# Patient Record
Sex: Female | Born: 1970 | Hispanic: Yes | Marital: Married | Smoking: Never smoker
Health system: Southern US, Community
[De-identification: ages and names within clinical notes are randomized; demographics above are authoritative.]

## PROBLEM LIST (undated history)

## (undated) DIAGNOSIS — K8020 Calculus of gallbladder without cholecystitis without obstruction: Secondary | ICD-10-CM

## (undated) DIAGNOSIS — N809 Endometriosis, unspecified: Secondary | ICD-10-CM

## (undated) DIAGNOSIS — I341 Nonrheumatic mitral (valve) prolapse: Secondary | ICD-10-CM

## (undated) DIAGNOSIS — K219 Gastro-esophageal reflux disease without esophagitis: Secondary | ICD-10-CM

## (undated) DIAGNOSIS — G43909 Migraine, unspecified, not intractable, without status migrainosus: Secondary | ICD-10-CM

## (undated) DIAGNOSIS — R112 Nausea with vomiting, unspecified: Secondary | ICD-10-CM

## (undated) DIAGNOSIS — Z8489 Family history of other specified conditions: Secondary | ICD-10-CM

## (undated) DIAGNOSIS — Z9889 Other specified postprocedural states: Secondary | ICD-10-CM

## (undated) DIAGNOSIS — R519 Headache, unspecified: Secondary | ICD-10-CM

## (undated) HISTORY — PX: CHOLECYSTECTOMY: SHX55

## (undated) HISTORY — DX: Migraine, unspecified, not intractable, without status migrainosus: G43.909

## (undated) HISTORY — DX: Endometriosis, unspecified: N80.9

## (undated) HISTORY — PX: TUBAL LIGATION: SHX77

## (undated) HISTORY — DX: Gastro-esophageal reflux disease without esophagitis: K21.9

## (undated) HISTORY — PX: DIAGNOSTIC LAPAROSCOPY: SUR761

## (undated) HISTORY — PX: APPENDECTOMY: SHX54

---

## 2017-02-01 ENCOUNTER — Other Ambulatory Visit: Payer: Self-pay | Admitting: Obstetrics & Gynecology

## 2017-02-01 ENCOUNTER — Other Ambulatory Visit (HOSPITAL_COMMUNITY)
Admission: RE | Admit: 2017-02-01 | Discharge: 2017-02-01 | Disposition: A | Discharge status: Home / Self Care | Payer: BLUE CROSS/BLUE SHIELD | Source: Ambulatory Visit | Attending: Obstetrics & Gynecology | Admitting: Obstetrics & Gynecology

## 2017-02-01 DIAGNOSIS — Z1151 Encounter for screening for human papillomavirus (HPV): Secondary | ICD-10-CM | POA: Insufficient documentation

## 2017-02-01 DIAGNOSIS — Z01419 Encounter for gynecological examination (general) (routine) without abnormal findings: Secondary | ICD-10-CM | POA: Diagnosis present

## 2017-02-07 LAB — CYTOLOGY - PAP
DIAGNOSIS: NEGATIVE
HPV: NOT DETECTED

## 2018-12-02 ENCOUNTER — Other Ambulatory Visit: Payer: Self-pay | Admitting: Obstetrics & Gynecology

## 2018-12-02 DIAGNOSIS — Z1231 Encounter for screening mammogram for malignant neoplasm of breast: Secondary | ICD-10-CM

## 2018-12-03 ENCOUNTER — Ambulatory Visit: Payer: BLUE CROSS/BLUE SHIELD

## 2018-12-09 ENCOUNTER — Ambulatory Visit
Admission: RE | Admit: 2018-12-09 | Discharge: 2018-12-09 | Disposition: A | Discharge status: Home / Self Care | Payer: BLUE CROSS/BLUE SHIELD | Source: Ambulatory Visit | Attending: Obstetrics & Gynecology | Admitting: Obstetrics & Gynecology

## 2018-12-09 ENCOUNTER — Encounter: Payer: Self-pay | Admitting: Radiology

## 2018-12-09 DIAGNOSIS — Z1231 Encounter for screening mammogram for malignant neoplasm of breast: Secondary | ICD-10-CM

## 2019-05-27 ENCOUNTER — Other Ambulatory Visit: Payer: Self-pay | Admitting: Obstetrics and Gynecology

## 2019-06-05 NOTE — Patient Instructions (Addendum)
YOU ARE SCHEDULED FOR A COVID TEST _8-8-20________@_____945am_______ . THIS TEST MUST BE DONE BEFORE SURGERY. GO TO  [address], [place], [address] AND REMAIN IN YOUR CAR, THIS IS A DRIVE UP TEST. ONCE YOUR COVID TEST IS DONE PLEASE FOLLOW ALL THE QUARANTINE  INSTRUCTIONS GIVEN IN YOUR HANDOUT.      Your procedure is scheduled on 06-11-19   Report to [facility]   Call this number if you have problems the morning of surgery  [phone].   OUR ADDRESS IS [place].  WE ARE LOCATED IN [facility].                                     REMEMBER:  DO NOT EAT FOOD OR DRINK LIQUIDS AFTER MIDNIGHT .   TAKE THESE MEDICATIONS MORNING OF SURGERY WITH A SIP OF WATER:  _NONE_________________________________  IF YOU ARE SPENDING THE NIGHT AFTER SURGERY PLEASE BRING ALL YOUR PRESCRIPTION MEDICATIONS IN THEIR ORIGINAL BOTTLES.                                    DO NOT WEAR JEWERLY, MAKE UP, OR NAIL POLISH,  DO NOT WEAR LOTIONS, POWDERS, PERFUMES OR DEODORANT. DO NOT SHAVE FOR 24 HOURS PRIOR TO DAY OF SURGERY. MEN MAY SHAVE FACE AND NECK. CONTACTS, GLASSES, OR DENTURES MAY NOT BE WORN TO SURGERY.                                    [facility] IS NOT RESPONSIBLE  FOR ANY BELONGINGS.                                                                    Marland Kitchen                                                                                                          [facility] - Preparing for Surgery Before surgery, you can play an important role.  Because skin is not sterile, your skin needs to be as free of germs as possible.  You can reduce the number of germs on your skin by washing with CHG (chlorahexidine gluconate) soap before surgery.  CHG is an antiseptic cleaner which kills germs and bonds with the skin to continue killing germs even after washing. Please DO NOT use if you have an allergy to CHG or antibacterial soaps.  If your  skin becomes reddened/irritated stop using the CHG and inform your nurse when you arrive at  Short Stay. Do not shave (including legs and underarms) for at least 48 hours prior to the first CHG shower.  You may shave your face/neck. Please follow these instructions carefully:  1.  Shower with CHG Soap the night before surgery and the  morning of Surgery.  2.  If you choose to wash your hair, wash your hair first as usual with your  normal  shampoo.  3.  After you shampoo, rinse your hair and body thoroughly to remove the  shampoo.                           4.  Use CHG as you would any other liquid soap.  You can apply chg directly  to the skin and wash                       Gently with a scrungie or clean washcloth.  5.  Apply the CHG Soap to your body ONLY FROM THE NECK DOWN.   Do not use on face/ open                           Wound or open sores. Avoid contact with eyes, ears mouth and genitals (private parts).                       Wash face,  Genitals (private parts) with your normal soap.             6.  Wash thoroughly, paying special attention to the area where your surgery  will be performed.  7.  Thoroughly rinse your body with warm water from the neck down.  8.  DO NOT shower/wash with your normal soap after using and rinsing off  the CHG Soap.                9.  Pat yourself dry with a clean towel.            10.  Wear clean pajamas.            11.  Place clean sheets on your bed the night of your first shower and do not  sleep with pets. Day of Surgery : Do not apply any lotions/deodorants the morning of surgery.  Please wear clean clothes to the hospital/surgery center.  FAILURE TO FOLLOW THESE INSTRUCTIONS MAY RESULT IN THE CANCELLATION OF YOUR SURGERY PATIENT SIGNATURE_________________________________  NURSE SIGNATURE__________________________________  ________________________________________________________________________   Denise Reeves  An incentive spirometer  is a tool that can help keep your lungs clear and active. This tool measures how well you are filling your lungs with each breath. Taking long deep breaths may help reverse or decrease the chance of developing breathing (pulmonary) problems (especially infection) following:  A long period of time when you are unable to move or be active. BEFORE THE PROCEDURE   If the spirometer includes an indicator to show your best effort, your nurse or respiratory therapist will set it to a desired goal.  If possible, sit up straight or lean slightly forward. Try not to slouch.  Hold the incentive spirometer in an upright position. INSTRUCTIONS FOR USE  1. Sit on the edge of your bed if possible, or sit up as far as you can in bed or on a chair. 2. Hold the incentive spirometer in an upright position. 3. Breathe out normally. 4. Place the  mouthpiece in your mouth and seal your lips tightly around it. 5. Breathe in slowly and as deeply as possible, raising the piston or the ball toward the top of the column. 6. Hold your breath for 3-5 seconds or for as long as possible. Allow the piston or ball to fall to the bottom of the column. 7. Remove the mouthpiece from your mouth and breathe out normally. 8. Rest for a few seconds and repeat Steps 1 through 7 at least 10 times every 1-2 hours when you are awake. Take your time and take a few normal breaths between deep breaths. 9. The spirometer may include an indicator to show your best effort. Use the indicator as a goal to work toward during each repetition. 10. After each set of 10 deep breaths, practice coughing to be sure your lungs are clear. If you have an incision (the cut made at the time of surgery), support your incision when coughing by placing a pillow or rolled up towels firmly against it. Once you are able to get out of bed, walk around indoors and cough well. You may stop using the incentive spirometer when instructed by your caregiver.  RISKS AND  COMPLICATIONS  Take your time so you do not get dizzy or light-headed.  If you are in pain, you may need to take or ask for pain medication before doing incentive spirometry. It is harder to take a deep breath if you are having pain. AFTER USE  Rest and breathe slowly and easily.  It can be helpful to keep track of a log of your progress. Your caregiver can provide you with a simple table to help with this. If you are using the spirometer at home, follow these instructions: [place] IF:   You are having difficultly using the spirometer.  You have trouble using the spirometer as often as instructed.  Your pain medication is not giving enough relief while using the spirometer.  You develop fever of 100.5 F (38.1 C) or higher. SEEK IMMEDIATE MEDICAL CARE IF:   You cough up bloody sputum that had not been present before.  You develop fever of 102 F (38.9 C) or greater.  You develop worsening pain at or near the incision site. MAKE SURE YOU:   Understand these instructions.  Will watch your condition.  Will get help right away if you are not doing well or get worse. Document Released: 02/26/2007 Document Revised: 01/08/2012 Document Reviewed: 04/29/2007 Exit[employer] Patient Information 2014 Exit[employer], [place].   ________________________________________________________________________  WHAT IS A BLOOD TRANSFUSION? Blood Transfusion Information  A transfusion is the replacement of blood or some of its parts. Blood is made up of multiple cells which provide different functions.  Red blood cells carry oxygen and are used for blood loss replacement.  White blood cells fight against infection.  Platelets control bleeding.  Plasma helps clot blood.  Other blood products are available for specialized needs, such as hemophilia or other clotting disorders. BEFORE THE TRANSFUSION  Who gives blood for transfusions?   Healthy volunteers who are fully evaluated to make sure  their blood is safe. This is blood bank blood. Transfusion therapy is the safest it has ever been in the practice of medicine. Before blood is taken from a donor, a complete history is taken to make sure that person has no history of diseases nor engages in risky social behavior (examples are intravenous drug use or sexual activity with multiple partners). The donor's travel history is screened to  minimize risk of transmitting infections, such as malaria. The donated blood is tested for signs of infectious diseases, such as HIV and hepatitis. The blood is then tested to be sure it is compatible with you in order to minimize the chance of a transfusion reaction. If you or a relative donates blood, this is often done in anticipation of surgery and is not appropriate for emergency situations. It takes many days to process the donated blood. RISKS AND COMPLICATIONS Although transfusion therapy is very safe and saves many lives, the main dangers of transfusion include:   Getting an infectious disease.  Developing a transfusion reaction. This is an allergic reaction to something in the blood you were given. Every precaution is taken to prevent this. The decision to have a blood transfusion has been considered carefully by your caregiver before blood is given. Blood is not given unless the benefits outweigh the risks. AFTER THE TRANSFUSION  Right after receiving a blood transfusion, you will usually feel much better and more energetic. This is especially true if your red blood cells have gotten low (anemic). The transfusion raises the level of the red blood cells which carry oxygen, and this usually causes an energy increase.  The nurse administering the transfusion will monitor you carefully for complications. HOME CARE INSTRUCTIONS  No special instructions are needed after a transfusion. You may find your energy is better. Speak with your caregiver about any limitations on activity for underlying diseases  you may have. SEEK MEDICAL CARE IF:   Your condition is not improving after your transfusion.  You develop redness or irritation at the intravenous (IV) site. SEEK IMMEDIATE MEDICAL CARE IF:  Any of the following symptoms occur over the next 12 hours:  Shaking chills.  You have a temperature by mouth above 102 F (38.9 C), not controlled by medicine.  Chest, back, or muscle pain.  People around you feel you are not acting correctly or are confused.  Shortness of breath or difficulty breathing.  Dizziness and fainting.  You get a rash or develop hives.  You have a decrease in urine output.  Your urine turns a dark color or changes to pink, red, or brown. Any of the following symptoms occur over the next 10 days:  You have a temperature by mouth above 102 F (38.9 C), not controlled by medicine.  Shortness of breath.  Weakness after normal activity.  The white part of the eye turns yellow (jaundice).  You have a decrease in the amount of urine or are urinating less often.  Your urine turns a dark color or changes to pink, red, or brown. Document Released: 10/13/2000 Document Revised: 01/08/2012 Document Reviewed: 06/01/2008 [facility] Patient Information 2014 [place], [place].  _______________________________________________________________________

## 2019-06-06 ENCOUNTER — Other Ambulatory Visit: Payer: Self-pay

## 2019-06-06 ENCOUNTER — Encounter (HOSPITAL_COMMUNITY): Payer: Self-pay

## 2019-06-06 ENCOUNTER — Encounter (INDEPENDENT_AMBULATORY_CARE_PROVIDER_SITE_OTHER): Payer: Self-pay

## 2019-06-06 ENCOUNTER — Encounter (HOSPITAL_COMMUNITY)
Admission: RE | Admit: 2019-06-06 | Discharge: 2019-06-06 | Disposition: A | Discharge status: Home / Self Care | Payer: BC Managed Care – PPO | Source: Ambulatory Visit | Attending: Obstetrics and Gynecology | Admitting: Obstetrics and Gynecology

## 2019-06-06 DIAGNOSIS — Z01812 Encounter for preprocedural laboratory examination: Secondary | ICD-10-CM | POA: Insufficient documentation

## 2019-06-06 DIAGNOSIS — N800 Endometriosis of uterus: Secondary | ICD-10-CM | POA: Diagnosis not present

## 2019-06-06 DIAGNOSIS — R102 Pelvic and perineal pain: Secondary | ICD-10-CM | POA: Diagnosis not present

## 2019-06-06 HISTORY — DX: Other specified postprocedural states: R11.2

## 2019-06-06 HISTORY — DX: Calculus of gallbladder without cholecystitis without obstruction: K80.20

## 2019-06-06 HISTORY — DX: Headache, unspecified: R51.9

## 2019-06-06 HISTORY — DX: Family history of other specified conditions: Z84.89

## 2019-06-06 HISTORY — DX: Nonrheumatic mitral (valve) prolapse: I34.1

## 2019-06-06 HISTORY — DX: Other specified postprocedural states: Z98.890

## 2019-06-06 LAB — CBC
HCT: 42.7 % (ref 36.0–46.0)
Hemoglobin: 14.1 g/dL (ref 12.0–15.0)
MCH: 30.3 pg (ref 26.0–34.0)
MCHC: 33 g/dL (ref 30.0–36.0)
MCV: 91.8 fL (ref 80.0–100.0)
Platelets: 307 10*3/uL (ref 150–400)
RBC: 4.65 MIL/uL (ref 3.87–5.11)
RDW: 13.5 % (ref 11.5–15.5)
WBC: 9.6 10*3/uL (ref 4.0–10.5)
nRBC: 0 % (ref 0.0–0.2)

## 2019-06-07 ENCOUNTER — Other Ambulatory Visit (HOSPITAL_COMMUNITY)
Admission: RE | Admit: 2019-06-07 | Discharge: 2019-06-07 | Disposition: A | Discharge status: Home / Self Care | Payer: BC Managed Care – PPO | Source: Ambulatory Visit | Attending: Obstetrics and Gynecology | Admitting: Obstetrics and Gynecology

## 2019-06-07 DIAGNOSIS — Z01812 Encounter for preprocedural laboratory examination: Secondary | ICD-10-CM | POA: Diagnosis not present

## 2019-06-07 DIAGNOSIS — Z20828 Contact with and (suspected) exposure to other viral communicable diseases: Secondary | ICD-10-CM | POA: Insufficient documentation

## 2019-06-07 LAB — SARS CORONAVIRUS 2 (TAT 6-24 HRS): SARS Coronavirus 2: NEGATIVE

## 2019-06-07 LAB — ABO/RH: ABO/RH(D): B POS

## 2019-06-11 ENCOUNTER — Ambulatory Visit (HOSPITAL_BASED_OUTPATIENT_CLINIC_OR_DEPARTMENT_OTHER): Payer: BC Managed Care – PPO | Admitting: Physician Assistant

## 2019-06-11 ENCOUNTER — Observation Stay (HOSPITAL_BASED_OUTPATIENT_CLINIC_OR_DEPARTMENT_OTHER)
Admission: RE | Admit: 2019-06-11 | Discharge: 2019-06-12 | Disposition: A | Discharge status: Home / Self Care | Payer: BC Managed Care – PPO | Attending: Obstetrics and Gynecology | Admitting: Obstetrics and Gynecology

## 2019-06-11 ENCOUNTER — Other Ambulatory Visit: Payer: Self-pay

## 2019-06-11 ENCOUNTER — Other Ambulatory Visit: Payer: Self-pay | Admitting: Obstetrics and Gynecology

## 2019-06-11 ENCOUNTER — Encounter (HOSPITAL_BASED_OUTPATIENT_CLINIC_OR_DEPARTMENT_OTHER): Payer: Self-pay | Admitting: Certified Registered Nurse Anesthetist

## 2019-06-11 ENCOUNTER — Ambulatory Visit (HOSPITAL_BASED_OUTPATIENT_CLINIC_OR_DEPARTMENT_OTHER): Payer: BC Managed Care – PPO | Admitting: Certified Registered Nurse Anesthetist

## 2019-06-11 ENCOUNTER — Encounter (HOSPITAL_BASED_OUTPATIENT_CLINIC_OR_DEPARTMENT_OTHER)
Admission: RE | Disposition: A | Discharge status: Home / Self Care | Payer: Self-pay | Source: Home / Self Care | Attending: Obstetrics and Gynecology

## 2019-06-11 DIAGNOSIS — R112 Nausea with vomiting, unspecified: Secondary | ICD-10-CM | POA: Diagnosis not present

## 2019-06-11 DIAGNOSIS — Z90710 Acquired absence of both cervix and uterus: Secondary | ICD-10-CM | POA: Diagnosis present

## 2019-06-11 DIAGNOSIS — N9971 Accidental puncture and laceration of a genitourinary system organ or structure during a genitourinary system procedure: Secondary | ICD-10-CM | POA: Diagnosis not present

## 2019-06-11 DIAGNOSIS — N920 Excessive and frequent menstruation with regular cycle: Secondary | ICD-10-CM | POA: Diagnosis present

## 2019-06-11 DIAGNOSIS — Y658 Other specified misadventures during surgical and medical care: Secondary | ICD-10-CM | POA: Insufficient documentation

## 2019-06-11 DIAGNOSIS — N809 Endometriosis, unspecified: Secondary | ICD-10-CM | POA: Diagnosis present

## 2019-06-11 DIAGNOSIS — Y838 Other surgical procedures as the cause of abnormal reaction of the patient, or of later complication, without mention of misadventure at the time of the procedure: Secondary | ICD-10-CM | POA: Diagnosis not present

## 2019-06-11 DIAGNOSIS — R102 Pelvic and perineal pain: Secondary | ICD-10-CM | POA: Diagnosis not present

## 2019-06-11 DIAGNOSIS — N800 Endometriosis of uterus: Secondary | ICD-10-CM | POA: Diagnosis present

## 2019-06-11 HISTORY — PX: ROBOTIC ASSISTED LAPAROSCOPIC HYSTERECTOMY AND SALPINGECTOMY: SHX6379

## 2019-06-11 LAB — POCT PREGNANCY, URINE: Preg Test, Ur: NEGATIVE

## 2019-06-11 LAB — TYPE AND SCREEN
ABO/RH(D): B POS
Antibody Screen: NEGATIVE

## 2019-06-11 SURGERY — XI ROBOTIC ASSISTED LAPAROSCOPIC HYSTERECTOMY AND SALPINGECTOMY
Anesthesia: General | Laterality: Bilateral

## 2019-06-11 MED ORDER — EPHEDRINE SULFATE-NACL 50-0.9 MG/10ML-% IV SOSY
PREFILLED_SYRINGE | INTRAVENOUS | Status: DC | PRN
  Administered 2019-06-11: 10 mg via INTRAVENOUS

## 2019-06-11 MED ORDER — ZOLPIDEM TARTRATE 5 MG PO TABS
5.0000 mg | ORAL_TABLET | Freq: Every evening | ORAL | Status: DC | PRN
  Filled 2019-06-11: qty 1

## 2019-06-11 MED ORDER — FENTANYL CITRATE (PF) 250 MCG/5ML IJ SOLN
INTRAMUSCULAR | Status: AC
  Filled 2019-06-11: qty 5

## 2019-06-11 MED ORDER — ACETAMINOPHEN 325 MG PO TABS
650.0000 mg | ORAL_TABLET | ORAL | Status: DC | PRN
  Filled 2019-06-11: qty 2

## 2019-06-11 MED ORDER — HYDROMORPHONE HCL 1 MG/ML IJ SOLN
INTRAMUSCULAR | Status: AC
  Filled 2019-06-11: qty 1

## 2019-06-11 MED ORDER — SCOPOLAMINE 1 MG/3DAYS TD PT72
MEDICATED_PATCH | TRANSDERMAL | Status: AC
  Filled 2019-06-11: qty 1

## 2019-06-11 MED ORDER — HYDROMORPHONE HCL 1 MG/ML IJ SOLN
0.2000 mg | INTRAMUSCULAR | Status: DC | PRN
  Administered 2019-06-11 (×2): 0.5 mg via INTRAVENOUS
  Filled 2019-06-11: qty 1

## 2019-06-11 MED ORDER — LACTATED RINGERS IV SOLN
INTRAVENOUS | Status: DC
  Administered 2019-06-11: 17:00:00 via INTRAVENOUS
  Filled 2019-06-11 (×2): qty 1000

## 2019-06-11 MED ORDER — CEFAZOLIN SODIUM-DEXTROSE 2-4 GM/100ML-% IV SOLN
INTRAVENOUS | Status: AC
  Filled 2019-06-11: qty 100

## 2019-06-11 MED ORDER — CELECOXIB 400 MG PO CAPS
400.0000 mg | ORAL_CAPSULE | ORAL | Status: AC
  Administered 2019-06-11: 400 mg via ORAL
  Filled 2019-06-11: qty 1

## 2019-06-11 MED ORDER — LACTATED RINGERS IV SOLN
INTRAVENOUS | Status: DC
  Administered 2019-06-11 – 2019-06-12 (×3): via INTRAVENOUS
  Filled 2019-06-11 (×2): qty 1000

## 2019-06-11 MED ORDER — KETOROLAC TROMETHAMINE 30 MG/ML IJ SOLN
30.0000 mg | Freq: Four times a day (QID) | INTRAMUSCULAR | Status: DC
  Administered 2019-06-11 – 2019-06-12 (×2): 30 mg via INTRAVENOUS
  Filled 2019-06-11 (×2): qty 1

## 2019-06-11 MED ORDER — SUGAMMADEX SODIUM 200 MG/2ML IV SOLN
INTRAVENOUS | Status: DC | PRN
  Administered 2019-06-11: 150 mg via INTRAVENOUS

## 2019-06-11 MED ORDER — PHENYLEPHRINE 40 MCG/ML (10ML) SYRINGE FOR IV PUSH (FOR BLOOD PRESSURE SUPPORT)
PREFILLED_SYRINGE | INTRAVENOUS | Status: DC | PRN
  Administered 2019-06-11: 80 ug via INTRAVENOUS
  Administered 2019-06-11 (×2): 40 ug via INTRAVENOUS
  Administered 2019-06-11: 120 ug via INTRAVENOUS
  Administered 2019-06-11: 80 ug via INTRAVENOUS

## 2019-06-11 MED ORDER — ONDANSETRON HCL 4 MG PO TABS
4.0000 mg | ORAL_TABLET | Freq: Four times a day (QID) | ORAL | Status: DC | PRN
  Filled 2019-06-11: qty 1

## 2019-06-11 MED ORDER — ARTIFICIAL TEARS OPHTHALMIC OINT
TOPICAL_OINTMENT | OPHTHALMIC | Status: DC | PRN
  Administered 2019-06-11: 1 via OPHTHALMIC

## 2019-06-11 MED ORDER — SIMETHICONE 80 MG PO CHEW
80.0000 mg | CHEWABLE_TABLET | Freq: Four times a day (QID) | ORAL | Status: DC | PRN
  Filled 2019-06-11: qty 1

## 2019-06-11 MED ORDER — PROPOFOL 10 MG/ML IV BOLUS
INTRAVENOUS | Status: AC
  Filled 2019-06-11: qty 20

## 2019-06-11 MED ORDER — ONDANSETRON HCL 4 MG/2ML IJ SOLN
INTRAMUSCULAR | Status: AC
  Filled 2019-06-11: qty 2

## 2019-06-11 MED ORDER — ROCURONIUM BROMIDE 10 MG/ML (PF) SYRINGE
PREFILLED_SYRINGE | INTRAVENOUS | Status: AC
  Filled 2019-06-11: qty 10

## 2019-06-11 MED ORDER — EPHEDRINE 5 MG/ML INJ
INTRAVENOUS | Status: AC
  Filled 2019-06-11: qty 10

## 2019-06-11 MED ORDER — HYDROMORPHONE HCL 1 MG/ML IJ SOLN
0.2500 mg | INTRAMUSCULAR | Status: DC | PRN
  Administered 2019-06-11: 0.25 mg via INTRAVENOUS
  Administered 2019-06-11 (×3): 0.5 mg via INTRAVENOUS
  Administered 2019-06-11: 0.25 mg via INTRAVENOUS
  Filled 2019-06-11: qty 0.5

## 2019-06-11 MED ORDER — MIDAZOLAM HCL 2 MG/2ML IJ SOLN
INTRAMUSCULAR | Status: DC | PRN
  Administered 2019-06-11: 2 mg via INTRAVENOUS

## 2019-06-11 MED ORDER — ONDANSETRON HCL 4 MG/2ML IJ SOLN
INTRAMUSCULAR | Status: DC | PRN
  Administered 2019-06-11: 4 mg via INTRAVENOUS

## 2019-06-11 MED ORDER — ACETAMINOPHEN 500 MG PO TABS
ORAL_TABLET | ORAL | Status: AC
  Filled 2019-06-11: qty 2

## 2019-06-11 MED ORDER — METHYLENE BLUE 0.5 % INJ SOLN
INTRAVENOUS | Status: AC
  Filled 2019-06-11: qty 10

## 2019-06-11 MED ORDER — STERILE WATER FOR IRRIGATION IR SOLN
Status: DC | PRN
  Administered 2019-06-11: 450 mL via INTRAVESICAL

## 2019-06-11 MED ORDER — ARTIFICIAL TEARS OPHTHALMIC OINT
TOPICAL_OINTMENT | OPHTHALMIC | Status: AC
  Filled 2019-06-11: qty 3.5

## 2019-06-11 MED ORDER — SODIUM CHLORIDE 0.9 % IV SOLN
INTRAVENOUS | Status: AC
  Filled 2019-06-11: qty 2

## 2019-06-11 MED ORDER — PROPOFOL 10 MG/ML IV BOLUS
INTRAVENOUS | Status: DC | PRN
  Administered 2019-06-11: 150 mg via INTRAVENOUS

## 2019-06-11 MED ORDER — MIDAZOLAM HCL 2 MG/2ML IJ SOLN
INTRAMUSCULAR | Status: AC
  Filled 2019-06-11: qty 2

## 2019-06-11 MED ORDER — PHENYLEPHRINE 40 MCG/ML (10ML) SYRINGE FOR IV PUSH (FOR BLOOD PRESSURE SUPPORT)
PREFILLED_SYRINGE | INTRAVENOUS | Status: AC
  Filled 2019-06-11: qty 10

## 2019-06-11 MED ORDER — PROMETHAZINE HCL 25 MG/ML IJ SOLN
6.2500 mg | INTRAMUSCULAR | Status: DC | PRN
  Filled 2019-06-11: qty 1

## 2019-06-11 MED ORDER — ROCURONIUM BROMIDE 10 MG/ML (PF) SYRINGE
PREFILLED_SYRINGE | INTRAVENOUS | Status: DC | PRN
  Administered 2019-06-11: 10 mg via INTRAVENOUS
  Administered 2019-06-11: 50 mg via INTRAVENOUS

## 2019-06-11 MED ORDER — LIDOCAINE 2% (20 MG/ML) 5 ML SYRINGE
INTRAMUSCULAR | Status: AC
  Filled 2019-06-11: qty 5

## 2019-06-11 MED ORDER — SENNA 8.6 MG PO TABS
1.0000 | ORAL_TABLET | Freq: Two times a day (BID) | ORAL | Status: DC
  Administered 2019-06-11: 8.6 mg via ORAL
  Filled 2019-06-11 (×2): qty 1

## 2019-06-11 MED ORDER — SODIUM CHLORIDE 0.9 % IV SOLN
2.0000 g | INTRAVENOUS | Status: AC
  Administered 2019-06-11: 2 g via INTRAVENOUS
  Filled 2019-06-11: qty 2

## 2019-06-11 MED ORDER — KETOROLAC TROMETHAMINE 30 MG/ML IJ SOLN
INTRAMUSCULAR | Status: AC
  Filled 2019-06-11: qty 1

## 2019-06-11 MED ORDER — SODIUM CHLORIDE 0.9 % IV SOLN
INTRAVENOUS | Status: DC | PRN
  Administered 2019-06-11: 10 mL
  Administered 2019-06-11: 35 mL
  Administered 2019-06-11: 60 mL

## 2019-06-11 MED ORDER — ALUM & MAG HYDROXIDE-SIMETH 200-200-20 MG/5ML PO SUSP
30.0000 mL | ORAL | Status: DC | PRN
  Filled 2019-06-11: qty 30

## 2019-06-11 MED ORDER — DEXAMETHASONE SODIUM PHOSPHATE 10 MG/ML IJ SOLN
INTRAMUSCULAR | Status: DC | PRN
  Administered 2019-06-11: 8 mg via INTRAVENOUS

## 2019-06-11 MED ORDER — ONDANSETRON HCL 4 MG/2ML IJ SOLN
4.0000 mg | Freq: Four times a day (QID) | INTRAMUSCULAR | Status: DC | PRN
  Administered 2019-06-11 – 2019-06-12 (×2): 4 mg via INTRAVENOUS
  Filled 2019-06-11: qty 2

## 2019-06-11 MED ORDER — LIDOCAINE HCL (CARDIAC) PF 100 MG/5ML IV SOSY
PREFILLED_SYRINGE | INTRAVENOUS | Status: DC | PRN
  Administered 2019-06-11: 100 mg via INTRAVENOUS

## 2019-06-11 MED ORDER — MENTHOL 3 MG MT LOZG
1.0000 | LOZENGE | OROMUCOSAL | Status: DC | PRN
  Filled 2019-06-11: qty 9

## 2019-06-11 MED ORDER — SODIUM CHLORIDE 0.9 % IR SOLN
Status: DC | PRN
  Administered 2019-06-11: 3000 mL

## 2019-06-11 MED ORDER — FENTANYL CITRATE (PF) 250 MCG/5ML IJ SOLN
INTRAMUSCULAR | Status: DC | PRN
  Administered 2019-06-11: 100 ug via INTRAVENOUS
  Administered 2019-06-11 (×3): 50 ug via INTRAVENOUS

## 2019-06-11 MED ORDER — IBUPROFEN 800 MG PO TABS
800.0000 mg | ORAL_TABLET | Freq: Four times a day (QID) | ORAL | Status: DC
  Filled 2019-06-11: qty 1

## 2019-06-11 MED ORDER — KETOROLAC TROMETHAMINE 30 MG/ML IJ SOLN
30.0000 mg | Freq: Once | INTRAMUSCULAR | Status: AC | PRN
  Administered 2019-06-11: 30 mg via INTRAVENOUS
  Filled 2019-06-11: qty 1

## 2019-06-11 MED ORDER — DEXAMETHASONE SODIUM PHOSPHATE 10 MG/ML IJ SOLN
INTRAMUSCULAR | Status: AC
  Filled 2019-06-11: qty 1

## 2019-06-11 MED ORDER — ACETAMINOPHEN 500 MG PO TABS
1000.0000 mg | ORAL_TABLET | ORAL | Status: AC
  Administered 2019-06-11: 1000 mg via ORAL
  Filled 2019-06-11: qty 2

## 2019-06-11 MED ORDER — OXYCODONE HCL 5 MG PO TABS
5.0000 mg | ORAL_TABLET | ORAL | Status: DC | PRN
  Filled 2019-06-11: qty 1
  Filled 2019-06-11: qty 2

## 2019-06-11 MED ORDER — CELECOXIB 200 MG PO CAPS
ORAL_CAPSULE | ORAL | Status: AC
  Filled 2019-06-11: qty 2

## 2019-06-11 SURGICAL SUPPLY — 52 items
APPLICATOR ARISTA FLEXITIP XL (MISCELLANEOUS) ×1 IMPLANT
BARRIER ADHS 3X4 INTERCEED (GAUZE/BANDAGES/DRESSINGS) IMPLANT
CATH FOLEY 3WAY  5CC 16FR (CATHETERS) ×1
CATH FOLEY 3WAY 5CC 16FR (CATHETERS) ×1 IMPLANT
COVER BACK TABLE 60X90IN (DRAPES) ×2 IMPLANT
COVER TIP SHEARS 8 DVNC (MISCELLANEOUS) ×1 IMPLANT
COVER TIP SHEARS 8MM DA VINCI (MISCELLANEOUS) ×1
DECANTER SPIKE VIAL GLASS SM (MISCELLANEOUS) ×4 IMPLANT
DEFOGGER SCOPE WARMER CLEARIFY (MISCELLANEOUS) ×2 IMPLANT
DERMABOND ADVANCED (GAUZE/BANDAGES/DRESSINGS) ×1
DERMABOND ADVANCED .7 DNX12 (GAUZE/BANDAGES/DRESSINGS) ×1 IMPLANT
DILATOR CANAL MILEX (MISCELLANEOUS) ×2 IMPLANT
DRAPE ARM DVNC X/XI (DISPOSABLE) ×4 IMPLANT
DRAPE COLUMN DVNC XI (DISPOSABLE) ×1 IMPLANT
DRAPE DA VINCI XI ARM (DISPOSABLE) ×4
DRAPE DA VINCI XI COLUMN (DISPOSABLE) ×1
DURAPREP 26ML APPLICATOR (WOUND CARE) ×2 IMPLANT
ELECT REM PT RETURN 9FT ADLT (ELECTROSURGICAL) ×2
ELECTRODE REM PT RTRN 9FT ADLT (ELECTROSURGICAL) ×1 IMPLANT
GLOVE BIOGEL M 6.5 STRL (GLOVE) ×6 IMPLANT
GLOVE BIOGEL PI IND STRL 7.0 (GLOVE) ×6 IMPLANT
GLOVE BIOGEL PI INDICATOR 7.0 (GLOVE) ×6
HEMOSTAT ARISTA ABSORB 3G PWDR (HEMOSTASIS) ×1 IMPLANT
IRRIG SUCT STRYKERFLOW 2 WTIP (MISCELLANEOUS) ×2
IRRIGATION SUCT STRKRFLW 2 WTP (MISCELLANEOUS) ×1 IMPLANT
LEGGING LITHOTOMY PAIR STRL (DRAPES) ×2 IMPLANT
OBTURATOR OPTICAL STANDARD 8MM (TROCAR)
OBTURATOR OPTICAL STND 8 DVNC (TROCAR)
OBTURATOR OPTICALSTD 8 DVNC (TROCAR) IMPLANT
OCCLUDER COLPOPNEUMO (BALLOONS) ×2 IMPLANT
PACK ROBOT WH (CUSTOM PROCEDURE TRAY) ×2 IMPLANT
PACK ROBOTIC GOWN (GOWN DISPOSABLE) ×2 IMPLANT
PACK TRENDGUARD 450 HYBRID PRO (MISCELLANEOUS) IMPLANT
PAD PREP 24X48 CUFFED NSTRL (MISCELLANEOUS) ×2 IMPLANT
PROTECTOR NERVE ULNAR (MISCELLANEOUS) ×2 IMPLANT
SEAL CANN UNIV 5-8 DVNC XI (MISCELLANEOUS) ×4 IMPLANT
SEAL XI 5MM-8MM UNIVERSAL (MISCELLANEOUS) ×4
SEALER VESSEL DA VINCI XI (MISCELLANEOUS) ×1
SEALER VESSEL EXT DVNC XI (MISCELLANEOUS) IMPLANT
SET CYSTO W/LG BORE CLAMP LF (SET/KITS/TRAYS/PACK) ×1 IMPLANT
SET TRI-LUMEN FLTR TB AIRSEAL (TUBING) IMPLANT
SUT VIC AB 0 CT1 27 (SUTURE) ×2
SUT VIC AB 0 CT1 27XBRD ANBCTR (SUTURE) ×2 IMPLANT
SUT VICRYL 0 UR6 27IN ABS (SUTURE) IMPLANT
SUT VICRYL RAPIDE 4/0 PS 2 (SUTURE) ×6 IMPLANT
SUT VLOC 180 0 9IN  GS21 (SUTURE) ×1
SUT VLOC 180 0 9IN GS21 (SUTURE) ×1 IMPLANT
TIP UTERINE 6.7X6CM WHT DISP (MISCELLANEOUS) ×1 IMPLANT
TOWEL OR 17X26 10 PK STRL BLUE (TOWEL DISPOSABLE) ×4 IMPLANT
TRENDGUARD 450 HYBRID PRO PACK (MISCELLANEOUS) ×2
TROCAR PORT AIRSEAL 8X120 (TROCAR) ×2 IMPLANT
WATER STERILE IRR 1000ML POUR (IV SOLUTION) ×2 IMPLANT

## 2019-06-11 NOTE — H&P (Deleted)
  The note originally documented on this encounter has been moved the the encounter in which it belongs.  

## 2019-06-11 NOTE — Anesthesia Postprocedure Evaluation (Signed)
Anesthesia Post Note  Patient: Denise Reeves  Procedure(s) Performed: XI ROBOTIC ASSISTED LAPAROSCOPIC HYSTERECTOMY WITH BILATERAL SALPINGECTOMY AND REPAIR OF VAGINAL LACERATION (Bilateral )     Patient location during evaluation: PACU Anesthesia Type: General Level of consciousness: sedated and awake Pain management: pain level controlled Vital Signs Assessment: post-procedure vital signs reviewed and stable Respiratory status: spontaneous breathing Cardiovascular status: stable Postop Assessment: no apparent nausea or vomiting Anesthetic complications: no    Last Vitals:  Vitals:   06/11/19 1530 06/11/19 1545  BP: (!) 105/56 (!) 111/52  Pulse: 95 95  Resp: 18 17  Temp:    SpO2: 100% 100%    Last Pain:  Vitals:   06/11/19 1515  TempSrc:   PainSc: 8    Pain Goal: Patients Stated Pain Goal: 7 (06/11/19 1515)                 Huston Foley

## 2019-06-11 NOTE — Op Note (Signed)
06/11/2019  2:10 PM  PATIENT:  Denise Reeves  48 y.o. female  PRE-OPERATIVE DIAGNOSIS:  N80.9 endometriosis determined by laparoscopy R10.2 pelvic pain  POST-OPERATIVE DIAGNOSIS:  N80.9 endometriosis determined by laparoscopy  PROCEDURE:  Procedure(s) with comments: XI ROBOTIC ASSISTED LAPAROSCOPIC HYSTERECTOMY WITH BILATERAL SALPINGECTOMY AND REPAIR OF VAGINAL LACERATION (Bilateral) - TS RNFA confirmed on 06/05/19. CS  SURGEON:  Surgeon(s) and Role:    Christophe Louis, MD - Primary  PHYSICIAN ASSISTANT:   ASSISTANTS: Gaylord Shih RNFA   ANESTHESIA:   general  EBL:  50 mL   BLOOD ADMINISTERED:none  DRAINS: none   LOCAL MEDICATIONS USED:  OTHER ropivicaine  SPECIMEN:  Source of Specimen:  Uterus cervix and bilateral fallopian tubes   DISPOSITION OF SPECIMEN:  PATHOLOGY  COUNTS:  YES  TOURNIQUET:  * No tourniquets in log *  DICTATION: .Dragon Dictation  PLAN OF CARE: Admit for overnight observation  PATIENT DISPOSITION:  PACU - hemodynamically stable.   Delay start of Pharmacological VTE agent (>24hrs) due to surgical blood loss or risk of bleeding: not applicable  Findings: normal external genitalia , Normal cervix , Stage 1 endometriosis ( implants noted in the posterior culdesac). Simple cyst on the left ovary. Right ovary appeared normal. ..   Procedure: The patient was taken to the operating room where she was placed under general anesthesia.Time out was performed. Marland Kitchen She was placed in dorsal lithotomy position and prepped and draped in the usual sterile fashion. A weighted speculum was placed into the vagina. A Deaver was placed anteriorly for retraction. The anterior lip of the cervix was grasped with a single-tooth tenaculum. The vaginal mucosa was injected with 2.5 cc of ropivacaine at the 2/4/ 8 and 10 o'clock positions. The uterus was sounded to 8 cm. the cervix was dilated to 6 mm . 0 vicryl suture placed at the 12 and 6:00 positions Of the cervix to  facilitate placement of a Ru mi uterine manipulator. The manipulator was placed without difficulty. Weighted speculum and Deaver were removed .  Attention was turned to the patient's abdomen where a 8 mm trocar was placed 2 cm above the umbilicus. under direct visualization . The pneumoperitoneum was achieved with PCO2 gas. The laparoscope was removed. 60 cc of ropivacaine were injected into the abdominal cavity. The laparoscope was reinserted. An 8 mm trocar was placed in the right upper quadrant 16 centimeters from the umbilicus.later connected to robotic arm #4). An 8MM incision was made in the Right upper quadrant TROCAR WAS PLACED 8 cm from the umbilicus. Later connected to robotic arm #3. An 8 mm incision was made in the left upper quadrant 16 cm from the umbilicus and connected to robot arm #1. Marland Kitchen Attention was turned to the left upper quadrant where a 8 mm midclavicular assistant trocar was placed. ( All incision sites were injected with 10cc of ropivacaine prior to port placement. )  Once all ports had been placed under direct visualization.The laparoscope was removed and the [place] robotic system was thin right-sided docked. The robotic arms were connected to the corresponding trocars as listed above. The laparoscope was then reinserted. The long tip bipolar forceps were placed into port #1. The pro-grasp was  placed in the port #4. A vessel sealerwas placed in port #3. All instruments were directed into the pelvis under direct visualization.  Attention was turned to the surgeons console.. The left mesosalpinx and left utero-ovarian ligament was cauterized and transected with the vessel sealer The broad ligament was  cauterized and transected with the vessel sealer .The round ligament was cauterized and transected with the vessel sealer The bladder was filled in a retrograde fashion with sterile water stained with methylene blue.  The anterior leaf of broad ligament was incised along the bladder  reflection to the midline.  The right  mesosalpinx and right utero-ovarian ligament was cauterized and transected with the vessel sealer. The right broad ligament was cauterized and transected with the vessel sealer. The right round ligament was cauterized and transected with the vessel sealer The broad ligament was incised to the midline. The bladder was dissected off the lower uterine segments of the cervix via sharp and blunt dissection.   The uterine arteries were skeleton bilaterally. They were  cauterized and transected with the vessel sealer The KOH ring was identified. The anterior colpotomy was performed followed by the posterior colpotomy. Once the uterus,cervix and bilateral fallopian tubes were completely excised was removed through the vagina. The  bipolar forceps and scissors were removed and log tip forceps were placed in the port #1 and the cutting needle driver was placed in to port #3.  The vaginal cuff was closed with running suture if 0 v-lock. The pelvis was irrigated. Marland KitchenMarland KitchenMarland KitchenExcellent hemostasis was noted. Arista was placed along the vaginal cuff.  All pelvic pedicles were examined and hemostasis was noted.  All instruments removed from the ports. All ports were removed under direct Visualization. The pneumoperitoneum was released. The skin incisions were closed with 4-0 Vicryl and then covered with Derma bond.   The vagina was inspected and a 1.5 cm laceration was noted on the medial aspect of the left labia minora. This was repaired with 3-0 vicryl.     Sponge lap and needle counts weIre correct x. The patient was awakened from anesthesia and taken to the recovery room in stable condition.

## 2019-06-11 NOTE — H&P (Signed)
Date of Initial H&P:06/11/2019 History reviewed, patient examined, no change in status, stable for surgery. Pt desires for both ovaries to remain as long as they appear normal. Pain is mostly on the right side.  Plan robotic assisted laparoscopic hysterectomy with bilateral salpingectomy

## 2019-06-11 NOTE — Transfer of Care (Signed)
Immediate Anesthesia Transfer of Care Note  Patient: Denise Reeves  Procedure(s) Performed: XI ROBOTIC ASSISTED LAPAROSCOPIC HYSTERECTOMY WITH BILATERAL SALPINGECTOMY AND REPAIR OF VAGINAL LACERATION (Bilateral )  Patient Location: PACU  Anesthesia Type:General  Level of Consciousness: awake, alert , oriented, drowsy and patient cooperative  Airway & Oxygen Therapy: Patient Spontanous Breathing and Patient connected to nasal cannula oxygen  Post-op Assessment: Report given to RN and Post -op Vital signs reviewed and stable  Post vital signs: Reviewed and stable  Last Vitals:  Vitals Value Taken Time  BP 109/65 06/11/19 1415  Temp    Pulse 94 06/11/19 1418  Resp 17 06/11/19 1418  SpO2 100 % 06/11/19 1418  Vitals shown include unvalidated device data.  Last Pain:  Vitals:   06/11/19 0956  TempSrc: Oral         Complications: No apparent anesthesia complications

## 2019-06-11 NOTE — H&P (Signed)
--------------------------------------------------------------------------------  Subjective:    Chief Complaint(s):      Pre op/ Pelvic pain and endometriosis       HPI:          Isolation Precautions          Respiratory Illness Screening  1. Is fever present / reported?  No,  2. Are respiratory illness symptom(s) present / reported?  No,  3. Are other symptom(s) present / reported?  No,  5. Has there been reported travel to a High Risk respiratory illness region?  No,  6. Has close* contact with person(s) known to have communicable illness been reported?  No,  7. Did travel or close contact (if applicable) occur within 14 days of symptom onset?  No.        General          48 y/o presents for pre op visit            She is scheduled for a robotic assisted laparoscopic hysterectomy with bilateral salpingectomy and right oophorectomy for the management of pelvic pain and endometriosis on 06/11/2019. Discussed removing right ovary, and leaving left ovary.             She has a h/o of pelvic pain and stage 1 endometriosis. This was diagnosed in [place]. She is taking Norethindrone 0.35 mg for management of endometriosis.             She has a h/o 1 cesarean section in 2011.             She has h/o BTL.             She has h/o of migraines.            She also mentions that she's had her gallbladder and appendix removed.             Pt. reports that she has mitral valve prolapse.             She had an u/s on 02/26/2017 at which time her uterus measured 9.0 x 4.9 x 5.2cm. There were no uterine anomalies seen. There was a 1.0cm complex cyst on the right ovary. The left ovary was normal.             Pt. denies h/o of ovarian cancer.            She states that she always experiences pain during menses. She states that she feels bloating in addition to the pain. She states that she is also experiencing dyspareunia.             She states that she experiences pain 3 days prior to her menses, than  7 days following her menses.     Current Medication:       Taking         Norethindrone 0.35 MG Tablet 1 tablet Orally Once a day.         Multivitamin Adult - Tablet Orally.         Medication List reviewed and reconciled with the patient.     Medical History:   migraine headache      hernia      endometriosis      mitral valve prolapse       Allergies/Intolerance:   Demerol - hypotension       Gyn History:   Sexual activity currently sexually active.   Periods : regular.   LMP 05/27/19.   Denies Birth  control BTL.   Last pap smear date 02/01/2017 negative/HPV.   Last mammogram date 12-09-2018 normal .   Denies Abnormal pap smear.   Denies STD none.        OB History:   Number of pregnancies  1.   Pregnancy # 1  live birth, C-section delivery, boy.        Surgical History:   [place] cholecystectomy      Laparoscopy- diagnosed with endometriosis/ tubal ligation      C-section x 1      open appendectomy       Hospitalization:   None in the past yr 04/2019       Family History:   Father: alive    Mother: deceased    Paternal aunt: diagnosed with Breast cancer    1 son(s) .      Social History:       General         Alcohol: yes, occasionally.           Children: 1, Boys.           Tobacco use cigarettes:  Never smoked, Tobacco history last updated  05/30/2019, Vaping  No.           Marital Status: Married.           no Recreational drug use, no.           OCCUPATION: employed, Designer, fashion/clothing.           Exercise: 3 times weekly.       ROS:       CONSTITUTIONAL         Chills  No.  Fatigue  No.  Fever  No.  Night sweats  No.  Recent travel outside [place]  No.  Sweats  No.  Weight change  No.         OPHTHALMOLOGY         Blurring of vision  no.  Change in vision  no.  Double vision  no.         ENT         Dizziness  no.  Nose bleeds  no.  Sore throat  no.  Teeth pain  no.         ALLERGY         Hives  no.         CARDIOLOGY         Chest pain  no.   High blood pressure  no.  Irregular heart beat  no.  Leg edema  no.  Palpitations  no.         RESPIRATORY         Shortness of breath  no.  Cough  no.  Wheezing  no.         UROLOGY         Pain with urination  no.  Urinary urgency  no.  Urinary frequency  no.  Urinary incontinence  no.  Difficulty urinating  No.  Blood in urine  No.         GASTROENTEROLOGY         Abdominal pain  no.  Appetite change  no.  Bloating/belching  no.  Blood in stool or on toilet paper  no.  Change in bowel movements  no.  Constipation  no.  Diarrhea  no.  Difficulty swallowing  no.  Nausea  no.         FEMALE REPRODUCTIVE  Vulvar pain  no.  Vulvar rash  no.  Abnormal vaginal bleeding  no.  Breast pain  no.  Nipple discharge  no.  Pain with intercourse  no.  Pelvic pain  yes, worse with menstruation.  Unusual vaginal discharge  no.  Vaginal itching  no.         MUSCULOSKELETAL         Muscle aches  no.         NEUROLOGY         Headache  no.  Tingling/numbness  no.  Weakness  no.         PSYCHOLOGY         Depression  no.  Anxiety  no.  Nervousness  no.  Sleep disturbances  no.  Suicidal ideation  no .         ENDOCRINOLOGY         Excessive thirst  no.  Excessive urination  no.  Hair loss  no.  Heat or cold intolerance  no.         HEMATOLOGY/LYMPH         Abnormal bleeding  no.  Easy bruising  no.  Swollen glands  no.         DERMATOLOGY         New/changing skin lesion  no.  Rash  no.  Sores  no.             Negative except as stated in HPI.   Objective:    Vitals:        Wt 142, Wt change 1 lb, Ht 62, BMI 25.97, Temp 98.0, Pulse sitting 65, BP sitting 102/60     Past Results:    Examination:          General Examination         CONSTITUTIONAL: alert, oriented, NAD .          SKIN:  moist, warm.          EYES:  Conjunctiva clear.          LUNGS: clear to auscultation bilaterally.          HEART:  regular rate and rhythm.          ABDOMEN: soft, non-tender/non-distended, bowel  sounds present .          FEMALE GENITOURINARY: normal external genitalia, labia - unremarkable, vagina - pink moist mucosa, no lesions or abnormal discharge, cervix - no discharge or lesions or CMT, adnexa - no masses or tenderness, uterus - nontender and normal size on palpation .          PSYCH:  affect normal, good eye contact.      Physical Examination:       Chaperone present          Chaperone present for pelvic exam, Chapman,Courtney 05/30/2019 11:59:22 AM > .            Pt aware of scribe services today.    Assessment:     Assessment:    Pelvic pain - R10.2 (Primary)      Endometriosis - N80.9        Plan:    Treatment:      Pelvic pain          Notes: She is scheduled for a robotic assisted laparoscopic hysterectomy with bilateral salpingectomy and right oophorectomy on 06/11/2019. Discussed risk of surgery with patient including but not limited to infection/bleeding, damage to bowel, bladder, ureters and  surrounding organs with the need for further surgery. Discussed risk of blood transfusion. Discussed risk of hiv/hep b&c with blood transfusion. Patient is aware of risks and wishes to receive blood if warranted. Pt. advised no eating or drinking night prior to surgery. She is advised no driving for 1 week following surgery. No sexual intercourse for at least 6-8 weeks. Pt. advised no heavy lifting of over 10lbs for 6-8 weeks following surgery.      Endometriosis          Notes: Pt. advised she can stop taking Norethindrone to see if symptoms improve. She is scheduled for a robotic assisted laparoscopic hysterectomy with bilateral salpingectomy and right oophorectomy on 06/11/2019. Risks were discussed with pt. for surgery.

## 2019-06-11 NOTE — Anesthesia Procedure Notes (Signed)
Procedure Name: Intubation Date/Time: 06/11/2019 11:47 AM Performed by: Lyn Hollingshead, MD Pre-anesthesia Checklist: Patient identified, Emergency Drugs available, Suction available and Patient being monitored Patient Re-evaluated:Patient Re-evaluated prior to induction Oxygen Delivery Method: Circle system utilized Preoxygenation: Pre-oxygenation with 100% oxygen Induction Type: IV induction Ventilation: Mask ventilation without difficulty Grade View: Grade I Tube type: Oral Tube size: 7.0 mm Number of attempts: 1 Airway Equipment and Method: Stylet Placement Confirmation: ETT inserted through vocal cords under direct vision,  positive ETCO2 and breath sounds checked- equal and bilateral Secured at: 20 cm Tube secured with: Tape Dental Injury: Teeth and Oropharynx as per pre-operative assessment

## 2019-06-11 NOTE — Anesthesia Preprocedure Evaluation (Signed)
Anesthesia Evaluation  Patient identified by MRN, date of birth, ID band Patient awake    Reviewed: Allergy & Precautions, H&P , Patient's Chart, lab work & pertinent test results  History of Anesthesia Complications (+) PONV and history of anesthetic complications  Airway Mallampati: I  TM Distance: >3 FB Neck ROM: full    Dental no notable dental hx. (+) Teeth Intact   Pulmonary neg pulmonary ROS,    Pulmonary exam normal breath sounds clear to auscultation       Cardiovascular negative cardio ROS Normal cardiovascular exam Rhythm:regular Rate:Normal     Neuro/Psych negative neurological ROS  negative psych ROS   GI/Hepatic negative GI ROS, Neg liver ROS,   Endo/Other  negative endocrine ROS  Renal/GU negative Renal ROS  negative genitourinary   Musculoskeletal negative musculoskeletal ROS (+)   Abdominal Normal abdominal exam  (+)   Peds  Hematology negative hematology ROS (+)   Anesthesia Other Findings   Reproductive/Obstetrics negative OB ROS                             Anesthesia Physical Anesthesia Plan  ASA: I  Anesthesia Plan: General   Post-op Pain Management:    Induction: Intravenous  PONV Risk Score and Plan: 4 or greater and Ondansetron, Dexamethasone, Midazolam and Scopolamine patch - Pre-op  Airway Management Planned: Oral ETT  Additional Equipment: None  Intra-op Plan:   Post-operative Plan: Extubation in OR  Informed Consent: I have reviewed the patients History and Physical, chart, labs and discussed the procedure including the risks, benefits and alternatives for the proposed anesthesia with the patient or authorized representative who has indicated his/her understanding and acceptance.     Dental advisory given  Plan Discussed with: CRNA  Anesthesia Plan Comments:         Anesthesia Quick Evaluation

## 2019-06-12 ENCOUNTER — Encounter (HOSPITAL_BASED_OUTPATIENT_CLINIC_OR_DEPARTMENT_OTHER): Payer: Self-pay | Admitting: Obstetrics and Gynecology

## 2019-06-12 DIAGNOSIS — N809 Endometriosis, unspecified: Secondary | ICD-10-CM | POA: Diagnosis present

## 2019-06-12 DIAGNOSIS — N920 Excessive and frequent menstruation with regular cycle: Secondary | ICD-10-CM | POA: Diagnosis present

## 2019-06-12 DIAGNOSIS — N800 Endometriosis of uterus: Secondary | ICD-10-CM | POA: Diagnosis not present

## 2019-06-12 LAB — CBC
HCT: 33.2 % — ABNORMAL LOW (ref 36.0–46.0)
Hemoglobin: 10.8 g/dL — ABNORMAL LOW (ref 12.0–15.0)
MCH: 30.4 pg (ref 26.0–34.0)
MCHC: 32.5 g/dL (ref 30.0–36.0)
MCV: 93.5 fL (ref 80.0–100.0)
Platelets: 236 10*3/uL (ref 150–400)
RBC: 3.55 MIL/uL — ABNORMAL LOW (ref 3.87–5.11)
RDW: 13.4 % (ref 11.5–15.5)
WBC: 12.4 10*3/uL — ABNORMAL HIGH (ref 4.0–10.5)
nRBC: 0 % (ref 0.0–0.2)

## 2019-06-12 MED ORDER — KETOROLAC TROMETHAMINE 30 MG/ML IJ SOLN
INTRAMUSCULAR | Status: AC
  Filled 2019-06-12: qty 1

## 2019-06-12 MED ORDER — IBUPROFEN 800 MG PO TABS: 800.0000 mg | ORAL_TABLET | Freq: Three times a day (TID) | ORAL | 1 refills | Status: DC | PRN

## 2019-06-12 MED ORDER — ONDANSETRON HCL 4 MG/2ML IJ SOLN
INTRAMUSCULAR | Status: AC
  Filled 2019-06-12: qty 2

## 2019-06-12 MED ORDER — ONDANSETRON HCL 4 MG PO TABS: 4.0000 mg | ORAL_TABLET | Freq: Four times a day (QID) | ORAL | 0 refills | Status: DC | PRN

## 2019-06-12 MED ORDER — OXYCODONE HCL 5 MG PO TABS: 5.0000 mg | ORAL_TABLET | ORAL | 0 refills | Status: DC | PRN

## 2019-06-12 NOTE — Discharge Instructions (Signed)
Laparoscopically Assisted Vaginal Hysterectomy °A laparoscopically assisted vaginal hysterectomy (LAVH) is a surgical procedure to remove the uterus and cervix. Sometimes, the ovaries and fallopian tubes are also removed. This surgery may be done to treat problems such as: °· Noncancerous growths in the uterus (uterine fibroids) that cause symptoms. °· A condition that causes the lining of the uterus to grow in other areas (endometriosis). °· Problems with pelvic support. °· Cancer of the cervix, ovaries, uterus, or tissue that lines the uterus (endometrium). °· Excessive (dysfunctional) uterine bleeding. °During an LAVH, some of the surgical removal is done through the vagina, and the rest is done through a few small incisions in the abdomen. This technique may be an option for women who are not able to have a vaginal hysterectomy. °Tell a health care provider about: °· Any allergies you have. °· All medicines you are taking, including vitamins, herbs, eye drops, creams, and over-the-counter medicines. °· Any problems you or family members have had with anesthetic medicines. °· Any blood disorders you have. °· Any surgeries you have had. °· Any medical conditions you have. °· Whether you are pregnant or may be pregnant. °What are the risks? °Generally, this is a safe procedure. However, problems may occur, including: °· Infection. °· Bleeding. °· Allergic reactions to medicines. °· Damage to other structures or organs. °· Difficulty breathing. °What happens before the procedure? °Staying hydrated °Follow instructions from your health care provider about hydration, which may include: °· Up to 2 hours before the procedure - you may continue to drink clear liquids, such as water, clear fruit juice, black coffee, and plain tea. °Eating and drinking restrictions °Follow instructions from your health care provider about eating and drinking, which may include: °· 8 hours before the procedure - stop eating heavy meals or  foods such as meat, fried foods, or fatty foods. °· 6 hours before the procedure - stop eating light meals or foods, such as toast or cereal. °· 6 hours before the procedure - stop drinking milk or drinks that contain milk. °· 2 hours before the procedure - stop drinking clear liquids. °Medicines °· Ask your health care provider about: °? Changing or stopping your regular medicines. This is especially important if you are taking diabetes medicines or blood thinners. °? Taking over-the-counter medicines, vitamins, herbs, and supplements. °? Taking medicines such as aspirin and ibuprofen. These medicines can thin your blood. Do not take these medicines unless your health care provider tells you to take them. °· You may be asked to take a medicine to empty your colon (bowel preparation). °· You may be given antibiotic medicine to help prevent infection. °General instructions °· Plan to have someone take you home from the hospital or clinic. °· Ask your health care provider how your surgical site will be marked or identified. °· You may be asked to shower with a germ-killing soap. °· Do not use any products that contain nicotine or tobacco, such as cigarettes and e-cigarettes. These can delay healing after surgery. If you need help quitting, ask your health care provider. °What happens during the procedure? °· To lower your risk of infection: °? Your health care team will wash or sanitize their hands. °? Hair may be removed from the surgical area. °? Your skin will be washed with soap. °· An IV will be inserted into one of your veins. °· You will be given one or more of the following: °? A medicine to help you relax (sedative). °? A medicine to   make you fall asleep (general anesthetic).  You may have a flexible tube (catheter) put into your bladder to drain urine.  You may have a tube put through your nose or mouth down into your stomach (nasogastric tube). The nasogastric tube will remove digestive fluids and  prevent nausea and vomiting.  Tight-fitting (compression) stockings will be placed on your legs to promote circulation.  Three or four small incisions will be made in your abdomen. An incision will also be made in your vagina.  Probes and tools will be inserted into the small incisions. The uterus and cervix (and possibly the ovaries and fallopian tubes) will be removed through your vagina as well as through the small incisions that were made in the abdomen.  The incisions will then be closed with stitches (sutures). The procedure may vary among health care providers and hospitals. What happens after the procedure?  Your blood pressure, heart rate, breathing rate, and blood oxygen level will be monitored until the medicines you were given have worn off.  You may have a liquid diet at first. You will most likely return to your usual diet the day after surgery.  You will still have the urinary catheter in place. It will likely be removed the day after surgery.  You may have to wear compression stockings. These stockings help to prevent blood clots and reduce swelling in your legs.  You will be encouraged to walk as soon as possible. You will also use a device or do breathing exercises to keep your lungs clear.  Do not drive for 24 hours if you were given a sedative. Summary  A laparoscopically assisted vaginal hysterectomy (LAVH) is a surgical procedure to remove the uterus and cervix, and sometimes the ovaries and fallopian tubes.  Follow instructions from your health care provider about eating and drinking before the procedure.  During an LAVH, some of the surgical removal is done through the vagina, and the rest is done through a few small incisions in the abdomen. This information is not intended to replace advice given to you by your health care provider. Make sure you discuss any questions you have with your health care provider. Document Released: 10/05/2011 Document Revised:  12/09/2018 Document Reviewed: 01/11/2017 Elsevier Patient Education  [employer].

## 2019-06-12 NOTE — Progress Notes (Signed)
06/12/2019 10:17 AM Dr. Landry Mellow called and updated regarding pt. Resolved nausea and void of 700 cc clear yellow/amber urine. Verbal order received ok to discharge patient home as written. Orders enacted.  Marguerette Sheller, Arville Lime

## 2019-06-12 NOTE — Discharge Summary (Signed)
Physician Discharge Summary  Patient ID: Denise Reeves MRN: [id number] DOB/AGE: 1971/02/06 48 y.o.  Admit date: 06/11/2019 Discharge date: 06/12/2019  Admission Diagnoses: Pelvic pain / Menorrhagia/ endometriosis   Discharge Diagnoses:  Active Problems:   S/P hysterectomy   Menorrhagia   Endometriosis   Discharged Condition: stable  Hospital Course: Pt was admitted for observation after robotic assisted laparoscopic hysterectomy with bilateral salpingectomy. She developed nausea and emesis however this resolved. She has return of bowel and bladder function.   Consults: None  Significant Diagnostic Studies: labs: HGB 10.8 on postop day 1   Treatments: surgery: robotic assisted laparoscopic hysterectomy with bilateral salpingectomy  Discharge Exam: Blood pressure (!) 94/49, pulse 79, temperature 98 F (36.7 C), temperature source Oral, resp. rate 16, height 5\' 2"  (1.575 m), weight 64.6 kg, last menstrual period 06/05/2019, SpO2 100 %. General appearance: alert, cooperative and no distress Resp: no distress  GI: soft appropriately tender nondistended +BS in all 4 quadrants Extremities: extremities normal, atraumatic, no cyanosis or edema Incision/Wound:well approximated no erythema or exudate   Disposition: Discharge disposition: 01-Home or Self Care       Discharge Instructions    Call MD for:  persistant nausea and vomiting   Complete by: As directed    Call MD for:  redness, tenderness, or signs of infection (pain, swelling, redness, odor or green/yellow discharge around incision site)   Complete by: As directed    Call MD for:  severe uncontrolled pain   Complete by: As directed    Call MD for:  temperature >100.4   Complete by: As directed    Diet - low sodium heart healthy   Complete by: As directed    Driving Restrictions   Complete by: As directed    Avoid driving for 1 week   Increase activity slowly   Complete by: As directed    Lifting restrictions    Complete by: As directed    Avoid lifting over 10 lbs   No dressing needed   Complete by: As directed    Sexual Activity Restrictions   Complete by: As directed    Avoid sex until approved by Dr. Landry Mellow ( 6-8 weeks)     Allergies as of 06/12/2019      Reactions   Demerol [meperidine Hcl]    Blood pressure drops too low      Medication List    TAKE these medications   acetaminophen 500 MG tablet Commonly known as: TYLENOL Take 1,000 mg by mouth every 6 (six) hours as needed for moderate pain.   aspirin-acetaminophen-caffeine 250-250-65 MG tablet Commonly known as: EXCEDRIN MIGRAINE Take 1 tablet by mouth every 6 (six) hours as needed for migraine.   ibuprofen 800 MG tablet Commonly known as: ADVIL Take 1 tablet (800 mg total) by mouth every 8 (eight) hours as needed.   mometasone 0.1 % cream Commonly known as: ELOCON Apply 1 application topically 2 (two) times daily.   multivitamin with minerals Tabs tablet Take 1 tablet by mouth daily.   ondansetron 4 MG tablet Commonly known as: ZOFRAN Take 1 tablet (4 mg total) by mouth every 6 (six) hours as needed for nausea.   oxyCODONE 5 MG immediate release tablet Commonly known as: Oxy IR/ROXICODONE Take 1-2 tablets (5-10 mg total) by mouth every 4 (four) hours as needed for moderate pain or severe pain.   THERATEARS OP Place 1 drop into both eyes 2 (two) times daily as needed (dry eyes).      Follow-up  Information    Christophe Louis, MD. Go in 2 week(s).   Specialty: Obstetrics and Gynecology Why: Patient already has an appointment for postoperative visit  Contact information: [address] [place] [address] [phone]           Signed: Christophe Louis 06/12/2019, 8:52 AM

## 2019-06-12 NOTE — Progress Notes (Signed)
Pt vomited approx 100cc light green emesis.  Pt states she feels much better now.  B/P 94/49 pulse 79.  Pt assisted up to BR - she voided 150cc amber urine.  Pt walked in hall 150cc feet

## 2019-06-13 ENCOUNTER — Encounter (HOSPITAL_BASED_OUTPATIENT_CLINIC_OR_DEPARTMENT_OTHER): Payer: Self-pay | Admitting: Obstetrics and Gynecology

## 2019-11-03 ENCOUNTER — Other Ambulatory Visit: Payer: Self-pay | Admitting: Obstetrics & Gynecology

## 2019-11-03 ENCOUNTER — Ambulatory Visit: Payer: Self-pay

## 2019-11-03 ENCOUNTER — Other Ambulatory Visit: Payer: Self-pay

## 2019-11-03 DIAGNOSIS — Z1231 Encounter for screening mammogram for malignant neoplasm of breast: Secondary | ICD-10-CM

## 2020-01-09 ENCOUNTER — Ambulatory Visit
Admission: RE | Admit: 2020-01-09 | Discharge: 2020-01-09 | Disposition: A | Discharge status: Home / Self Care | Payer: PRIVATE HEALTH INSURANCE | Source: Ambulatory Visit | Attending: Obstetrics & Gynecology | Admitting: Obstetrics & Gynecology

## 2020-01-09 ENCOUNTER — Other Ambulatory Visit: Payer: Self-pay

## 2020-01-09 DIAGNOSIS — Z1231 Encounter for screening mammogram for malignant neoplasm of breast: Secondary | ICD-10-CM

## 2021-06-14 ENCOUNTER — Other Ambulatory Visit: Payer: Self-pay | Admitting: Obstetrics and Gynecology

## 2021-06-14 DIAGNOSIS — Z1231 Encounter for screening mammogram for malignant neoplasm of breast: Secondary | ICD-10-CM

## 2021-07-08 ENCOUNTER — Other Ambulatory Visit: Payer: Self-pay

## 2021-07-08 ENCOUNTER — Ambulatory Visit
Admission: RE | Admit: 2021-07-08 | Discharge: 2021-07-08 | Disposition: A | Discharge status: Home / Self Care | Payer: BC Managed Care – PPO | Source: Ambulatory Visit | Attending: Obstetrics and Gynecology | Admitting: Obstetrics and Gynecology

## 2021-07-08 DIAGNOSIS — Z1231 Encounter for screening mammogram for malignant neoplasm of breast: Secondary | ICD-10-CM

## 2021-07-13 ENCOUNTER — Other Ambulatory Visit: Payer: Self-pay | Admitting: Obstetrics and Gynecology

## 2021-07-13 DIAGNOSIS — R928 Other abnormal and inconclusive findings on diagnostic imaging of breast: Secondary | ICD-10-CM

## 2021-07-28 ENCOUNTER — Other Ambulatory Visit: Payer: Self-pay

## 2021-07-28 ENCOUNTER — Ambulatory Visit
Admission: RE | Admit: 2021-07-28 | Discharge: 2021-07-28 | Disposition: A | Discharge status: Home / Self Care | Payer: BC Managed Care – PPO | Source: Ambulatory Visit | Attending: Obstetrics and Gynecology | Admitting: Obstetrics and Gynecology

## 2021-07-28 DIAGNOSIS — R928 Other abnormal and inconclusive findings on diagnostic imaging of breast: Secondary | ICD-10-CM

## 2022-03-16 ENCOUNTER — Encounter: Payer: Self-pay | Admitting: Gastroenterology

## 2022-04-27 ENCOUNTER — Ambulatory Visit: Payer: BC Managed Care – PPO | Admitting: Gastroenterology

## 2022-04-27 ENCOUNTER — Encounter: Payer: Self-pay | Admitting: Gastroenterology

## 2022-04-27 VITALS — BP 94/60 | HR 76 | Ht 60.0 in | Wt 151.0 lb

## 2022-04-27 DIAGNOSIS — K5900 Constipation, unspecified: Secondary | ICD-10-CM | POA: Diagnosis not present

## 2022-04-27 DIAGNOSIS — R1031 Right lower quadrant pain: Secondary | ICD-10-CM

## 2022-04-27 DIAGNOSIS — K921 Melena: Secondary | ICD-10-CM | POA: Diagnosis not present

## 2022-04-27 MED ORDER — NA SULFATE-K SULFATE-MG SULF 17.5-3.13-1.6 GM/177ML PO SOLN: 1.0000 | Freq: Once | ORAL | 0 refills | Status: AC

## 2022-04-27 NOTE — Progress Notes (Signed)
Assessment    R/O colorectal neoplasms, hemorrhoids Constipation associated with RLQ pain, bloating, rectal pain  GERD History of endometriosis S/P TAH BSO in 2020  Recommendations   Schedule colonoscopy. The risks (including bleeding, perforation, infection, missed lesions, medication reactions and possible hospitalization or surgery if complications occur), benefits, and alternatives to colonoscopy with possible biopsy and possible polypectomy were discussed with the patient and they consent to proceed.   Miralax daily, titrate dose for a complete bowel movement daily Prilosec 20 mg po qd (not prn) and follow antireflux measures The patient will attempt to obtain her colonoscopy, EGD report from [place]    HPI   Chief complaint: Hematochezia, constipation, RLQ pain bloating, rectal pain, heartburn  Patient profile:  Denise Reeves is a 51 y.o. female referred by Baird Lyons, MD for hematochezia, constipation, RLQ pain, abdominal bloating, rectal pain, heartburn.  She relates on problems with frequent constipation and incomplete fecal evacuation.  She takes MiraLAX daily as needed.  She relates that about once per month she has 2 to 3 days of constipation and then she will have a larger, harder stool which is associated with right lower quadrant discomfort, bloating and the bowel movement that follows is typically associated with rectal pain.  The rectal pain is transient, resolves within several hours.  She relates she had a colonoscopy and EGD performed in [place] around 2014 with only findings of a hiatal hernia.  She states she was treated with Prevacid on a daily basis and her reflux symptoms are well controlled.  More recently she notes episodes of frequent heartburn and takes over-the-counter Prilosec intermittently. Denies weight loss, diarrhea, change in stool caliber, melena, hematochezia, nausea, vomiting, dysphagia, chest pain.     Previous Labs /  Imaging::    Latest Ref Rng & Units 06/12/2019    5:02 AM 06/06/2019    1:56 PM  CBC  WBC 4.0 - 10.5 K/uL 12.4  9.6   Hemoglobin 12.0 - 15.0 g/dL 10.8  14.1   Hematocrit 36.0 - 46.0 % 33.2  42.7   Platelets 150 - 400 K/uL 236  307     No results found for: "LIPASE"     No data to display           Previous GI evaluation    Endoscopies:  Colonoscopy, EGD in [place] ~ 2014 - [facility] per patient otherwise negative. Reports not available  Imaging:     Past Medical History:  Diagnosis Date   Endometriosis    Family history of adverse reaction to anesthesia    mom PONV   Gallstones    post cholecystectomy   GERD (gastroesophageal reflux disease)    Headache    migraines  with menstrual cycles   Migraine    PONV (postoperative nausea and vomiting)    Prolapse of mitral valve    with regurgitation  [place] cardiologist in [place] . No cardiologist now   Past Surgical History:  Procedure Laterality Date   APPENDECTOMY     CESAREAN SECTION     10-17-2000   CHOLECYSTECTOMY     DIAGNOSTIC LAPAROSCOPY     ROBOTIC ASSISTED LAPAROSCOPIC HYSTERECTOMY AND SALPINGECTOMY Bilateral 06/11/2019   Procedure: XI ROBOTIC ASSISTED LAPAROSCOPIC HYSTERECTOMY WITH BILATERAL SALPINGECTOMY AND REPAIR OF VAGINAL LACERATION;  Surgeon: Christophe Louis, MD;  Location: [facility];  Service: Gynecology;  Laterality: Bilateral;  TS RNFA confirmed on 06/05/19. CS   TUBAL LIGATION     Family  History  Problem Relation Age of Onset   Osteoporosis Mother    Diabetes Mellitus I Father    Breast cancer Sister 79   Breast cancer Paternal Aunt    Colon cancer Paternal Aunt    Social History   Tobacco Use   Smoking status: Never   Smokeless tobacco: Never  Vaping Use   Vaping Use: Never used  Substance Use Topics   Alcohol use: Not Currently    Comment: occasional wine with dinner   Drug use: Never   Current Outpatient Medications  Medication Sig Dispense Refill   acetaminophen  (TYLENOL) 500 MG tablet Take 1,000 mg by mouth every 6 (six) hours as needed for moderate pain.     aspirin-acetaminophen-caffeine (EXCEDRIN MIGRAINE) 250-250-65 MG tablet Take 1 tablet by mouth every 6 (six) hours as needed for migraine.     Carboxymethylcellulose Sodium (THERATEARS OP) Place 1 drop into both eyes 2 (two) times daily as needed (dry eyes).     ibuprofen (ADVIL) 800 MG tablet Take 1 tablet (800 mg total) by mouth every 8 (eight) hours as needed. 30 tablet 1   mometasone (ELOCON) 0.1 % cream Apply 1 application topically 2 (two) times daily.     Multiple Vitamin (MULTIVITAMIN WITH MINERALS) TABS tablet Take 1 tablet by mouth daily.     ondansetron (ZOFRAN) 4 MG tablet Take 1 tablet (4 mg total) by mouth every 6 (six) hours as needed for nausea. 20 tablet 0   oxyCODONE (OXY IR/ROXICODONE) 5 MG immediate release tablet Take 1-2 tablets (5-10 mg total) by mouth every 4 (four) hours as needed for moderate pain or severe pain. 15 tablet 0   No current facility-administered medications for this visit.   Allergies  Allergen Reactions   Demerol [Meperidine Hcl]     Blood pressure drops too low    Review of Systems: All other systems reviewed and negative except where noted in HPI.    Physical Exam    Wt Readings from Last 3 Encounters:  06/11/19 142 lb 8 oz (64.6 kg)  06/06/19 142 lb 2 oz (64.5 kg)    LMP 06/05/2019  Constitutional:  Generally well appearing female in no acute distress. Psychiatric: Pleasant. Normal mood and affect. Behavior is normal. Anxious EENT: Pupils normal.  Conjunctivae are normal. No scleral icterus. Neck supple.  Cardiovascular: Normal rate, regular rhythm. No edema Pulmonary/chest: Effort normal and breath sounds normal. No wheezing, rales or rhonchi. Abdominal: Soft, nondistended, mild RLQ tenderness. Bowel sounds active throughout. There are no masses palpable. No hepatomegaly. Rectal: No lesions, no tenderness, Hemoccult negative  stool Neurological: Alert and oriented to person place and time.  Skin: Skin is warm and dry. No rashes noted.  Lucio Edward, MD   cc:  Referring Provider Daneil Dan, MD

## 2022-04-27 NOTE — Patient Instructions (Signed)
Take over the counter Miralax daily.   Take your Prilosec 20 mg daily.   Patient advised to avoid spicy, acidic, citrus, chocolate, mints, fruit and fruit juices.  Limit the intake of caffeine, alcohol and Soda.  Don't exercise too soon after eating.  Don't lie down within 3-4 hours of eating.  Elevate the head of your bed.  You have been scheduled for a colonoscopy. Please follow written instructions given to you at your visit today.  Please pick up your prep supplies at the pharmacy within the next 1-3 days. If you use inhalers (even only as needed), please bring them with you on the day of your procedure.  The [facility] providers would like to encourage you to use [facility] to communicate with providers for non-urgent requests or questions.  Due to long hold times on the telephone, sending your provider a message by [facility] may be a faster and more efficient way to get a response.  Please allow 48 business hours for a response.  Please remember that this is for non-urgent requests.   Due to recent changes in healthcare laws, you may see the results of your imaging and laboratory studies on MyChart before your provider has had a chance to review them.  We understand that in some cases there may be results that are confusing or concerning to you. Not all laboratory results come back in the same time frame and the provider may be waiting for multiple results in order to interpret others.  Please give [place] 48 hours in order for your provider to thoroughly review all the results before contacting the office for clarification of your results.   Thank you for choosing me and [facility].  Pricilla Riffle. Dagoberto Ligas., MD., [facility] Regal

## 2022-05-23 ENCOUNTER — Ambulatory Visit (AMBULATORY_SURGERY_CENTER): Payer: BC Managed Care – PPO | Admitting: Gastroenterology

## 2022-05-23 ENCOUNTER — Encounter: Payer: Self-pay | Admitting: Gastroenterology

## 2022-05-23 VITALS — BP 111/53 | HR 77 | Temp 97.3°F | Resp 13 | Ht 60.0 in | Wt 151.0 lb

## 2022-05-23 DIAGNOSIS — D122 Benign neoplasm of ascending colon: Secondary | ICD-10-CM

## 2022-05-23 DIAGNOSIS — K921 Melena: Secondary | ICD-10-CM

## 2022-05-23 DIAGNOSIS — K648 Other hemorrhoids: Secondary | ICD-10-CM

## 2022-05-23 MED ORDER — SODIUM CHLORIDE 0.9 % IV SOLN: 500.0000 mL | Freq: Once | INTRAVENOUS | Status: DC

## 2022-05-23 NOTE — Progress Notes (Signed)
See 04/27/2022 H&P, no changes

## 2022-05-23 NOTE — Progress Notes (Signed)
PT taken to PACU. Monitors in place. VSS. Report given to RN. 

## 2022-05-23 NOTE — Patient Instructions (Signed)
Resume previous diet and medications. Awaiting pathology results. Repeat Colonoscopy date to be determined based on pathology results.  YOU HAD AN ENDOSCOPIC PROCEDURE TODAY AT [facility]:   Refer to the procedure report that was given to you for any specific questions about what was found during the examination.  If the procedure report does not answer your questions, please call your gastroenterologist to clarify.  If you requested that your care partner not be given the details of your procedure findings, then the procedure report has been included in a sealed envelope for you to review at your convenience later.  YOU SHOULD EXPECT: Some feelings of bloating in the abdomen. Passage of more gas than usual.  Walking can help get rid of the air that was put into your GI tract during the procedure and reduce the bloating. If you had a lower endoscopy (such as a colonoscopy or flexible sigmoidoscopy) you may notice spotting of blood in your stool or on the toilet paper. If you underwent a bowel prep for your procedure, you may not have a normal bowel movement for a few days.  Please Note:  You might notice some irritation and congestion in your nose or some drainage.  This is from the oxygen used during your procedure.  There is no need for concern and it should clear up in a day or so.  SYMPTOMS TO REPORT IMMEDIATELY:  Following lower endoscopy (colonoscopy or flexible sigmoidoscopy):  Excessive amounts of blood in the stool  Significant tenderness or worsening of abdominal pains  Swelling of the abdomen that is new, acute  Fever of 100F or higher  For urgent or emergent issues, a gastroenterologist can be reached at any hour by calling [phone]. Do not use MyChart messaging for urgent concerns.    DIET:  We do recommend a small meal at first, but then you may proceed to your regular diet.  Drink plenty of fluids but you should avoid alcoholic beverages for 24  hours.  ACTIVITY:  You should plan to take it easy for the rest of today and you should NOT DRIVE or use heavy machinery until tomorrow (because of the sedation medicines used during the test).    FOLLOW UP: Our staff will call the number listed on your records the next business day following your procedure.  We will call around 7:15- 8:00 am to check on you and address any questions or concerns that you may have regarding the information given to you following your procedure. If we do not reach you, we will leave a message.  If you develop any symptoms (ie: fever, flu-like symptoms, shortness of breath, cough etc.) before then, please call [phone].  If you test positive for Covid 19 in the 2 weeks post procedure, please call and report this information to [place].    If any biopsies were taken you will be contacted by phone or by letter within the next 1-3 weeks.  Please call us at [phone] if you have not heard about the biopsies in 3 weeks.    SIGNATURES/CONFIDENTIALITY: You and/or your care partner have signed paperwork which will be entered into your electronic medical record.  These signatures attest to the fact that that the information above on your After Visit Summary has been reviewed and is understood.  Full responsibility of the confidentiality of this discharge information lies with you and/or your care-partner.

## 2022-05-23 NOTE — Op Note (Signed)
[facility] Patient Name: Denise Reeves Procedure Date: 05/23/2022 10:25 AM MRN: [id number] Endoscopist: Ladene Artist , MD Age: 51 Referring MD:  Date of Birth: 1971/04/12 Gender: Female Account #: [id number] Procedure:                Colonoscopy Indications:              Hematochezia Medicines:                Monitored Anesthesia Care Procedure:                Pre-Anesthesia Assessment:                           - Prior to the procedure, a History and Physical                            was performed, and patient medications and                            allergies were reviewed. The patient's tolerance of                            previous anesthesia was also reviewed. The risks                            and benefits of the procedure and the sedation                            options and risks were discussed with the patient.                            All questions were answered, and informed consent                            was obtained. Prior Anticoagulants: The patient has                            taken no previous anticoagulant or antiplatelet                            agents. ASA Grade Assessment: II - A patient with                            mild systemic disease. After reviewing the risks                            and benefits, the patient was deemed in                            satisfactory condition to undergo the procedure.                           After obtaining informed consent, the colonoscope  was passed under direct vision. Throughout the                            procedure, the patient's blood pressure, pulse, and                            oxygen saturations were monitored continuously. The                            Olympus Colonoscope [id number] was introduced through                            the anus and advanced to the the cecum, identified                            by appendiceal orifice and ileocecal  valve. The                            ileocecal valve, appendiceal orifice, and rectum                            were photographed. The quality of the bowel                            preparation was good. The colonoscopy was performed                            without difficulty. The patient tolerated the                            procedure well. Scope In: 10:32:54 AM Scope Out: 10:51:09 AM Scope Withdrawal Time: 0 hours 15 minutes 5 seconds  Total Procedure Duration: 0 hours 18 minutes 15 seconds  Findings:                 The perianal and digital rectal examinations were                            normal.                           A 12 mm polyp was found in the ascending colon. The                            polyp was sessile. The polyp was removed with a                            cold snare. Resection and retrieval were complete.                           External hemorrhoids were found during                            retroflexion. The hemorrhoids were small.  The exam was otherwise without abnormality on                            direct and retroflexion views. Complications:            No immediate complications. Estimated blood loss:                            None. Estimated Blood Loss:     Estimated blood loss: none. Impression:               - One 12 mm polyp in the ascending colon, removed                            with a cold snare. Resected and retrieved.                           - Samll external hemorrhoids.                           - The examination was otherwise normal on direct                            and retroflexion views. Recommendation:           - Repeat colonoscopy after studies are complete for                            surveillance based on pathology results.                           - Patient has a contact number available for                            emergencies. The signs and symptoms of potential                             delayed complications were discussed with the                            patient. Return to normal activities tomorrow.                            Written discharge instructions were provided to the                            patient.                           - Resume previous diet.                           - Continue present medications.                           - Await pathology results. Ladene Artist, MD 05/23/2022 10:58:27 AM This report has been signed electronically.

## 2022-05-24 ENCOUNTER — Telehealth: Payer: Self-pay | Admitting: *Deleted

## 2022-05-24 NOTE — Telephone Encounter (Signed)
No answer on  follow up call. Left message.   

## 2022-06-06 ENCOUNTER — Encounter: Payer: Self-pay | Admitting: Gastroenterology

## 2023-05-21 IMAGING — MG MM DIGITAL DIAGNOSTIC UNILAT*L* W/ TOMO W/ CAD
6 series · 6 of 18 positions shown · non-contrast
Comparison: Previous exam(s).

CLINICAL DATA: 50-year-old female for further evaluation of
possible LEFT breast mass on screening mammogram.

EXAM:
DIGITAL DIAGNOSTIC UNILATERAL LEFT MAMMOGRAM WITH TOMOSYNTHESIS AND
CAD; ULTRASOUND LEFT BREAST LIMITED
TECHNIQUE: Left digital diagnostic mammography and breast tomosynthesis was
performed. The images were evaluated with computer-aided detection.;
Targeted ultrasound examination of the left breast was performed.

[L MLO synth-2D (1 of 2)]
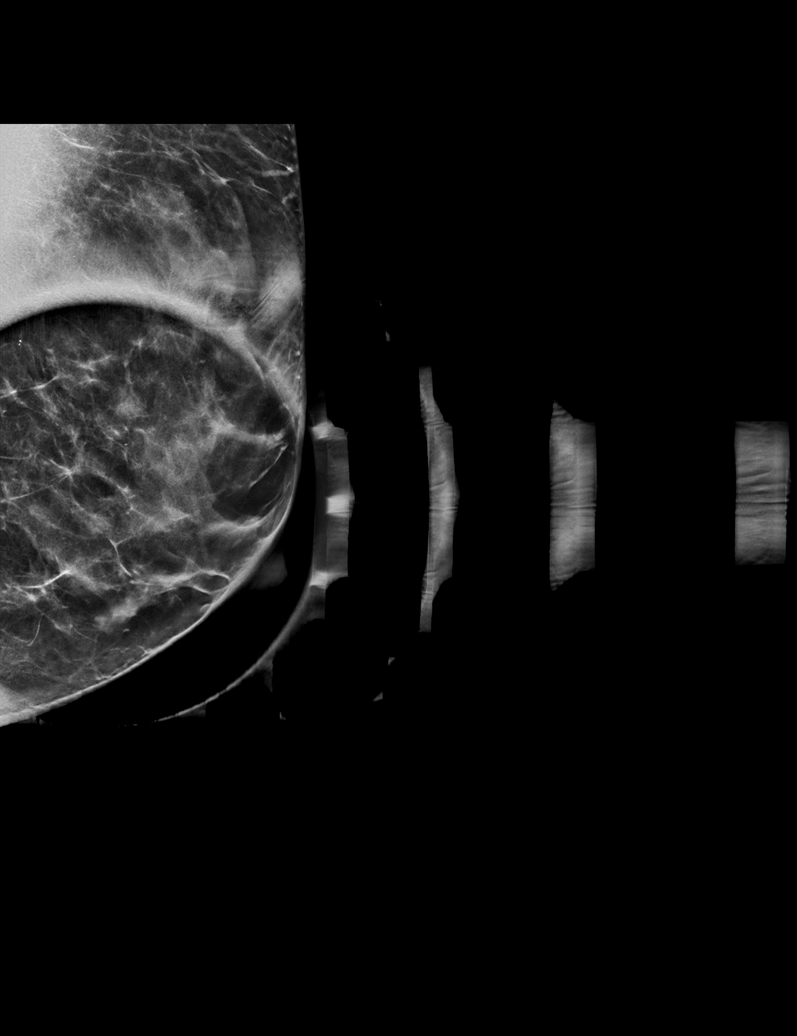

[L CC synth-2D]
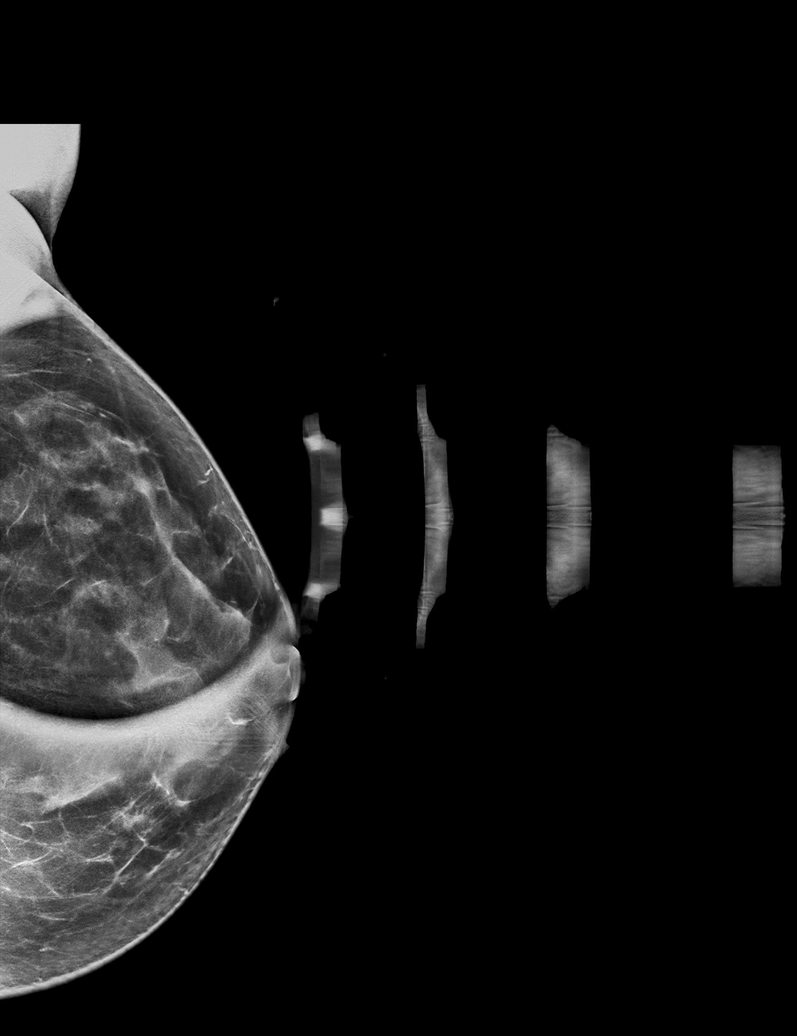

[L MLO synth-2D (2 of 2)]
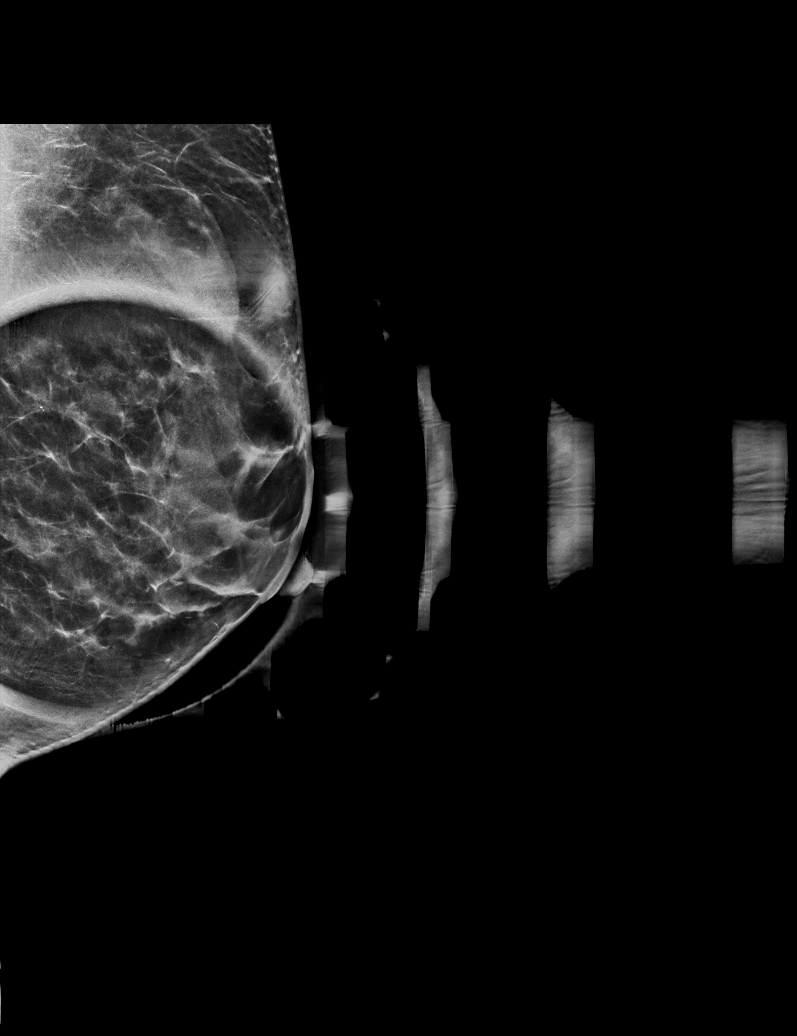

[L CC tomo · tomo slice 31/61.0]
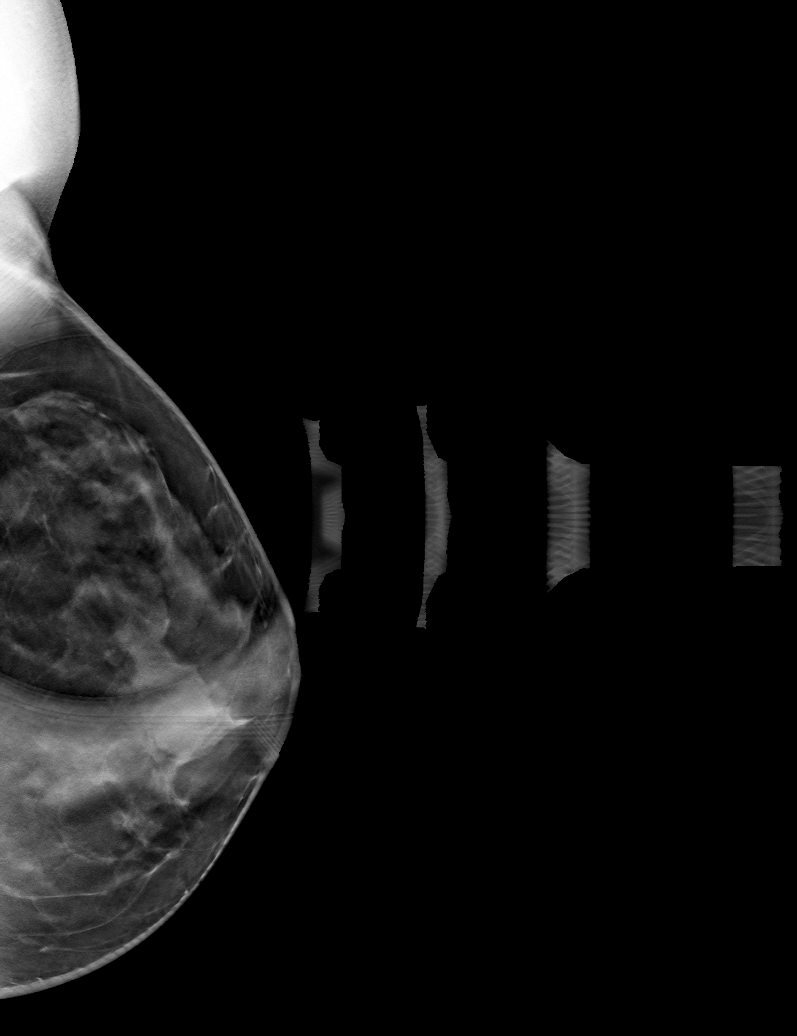

[L MLO tomo (1 of 2) · tomo slice 31/61.0]
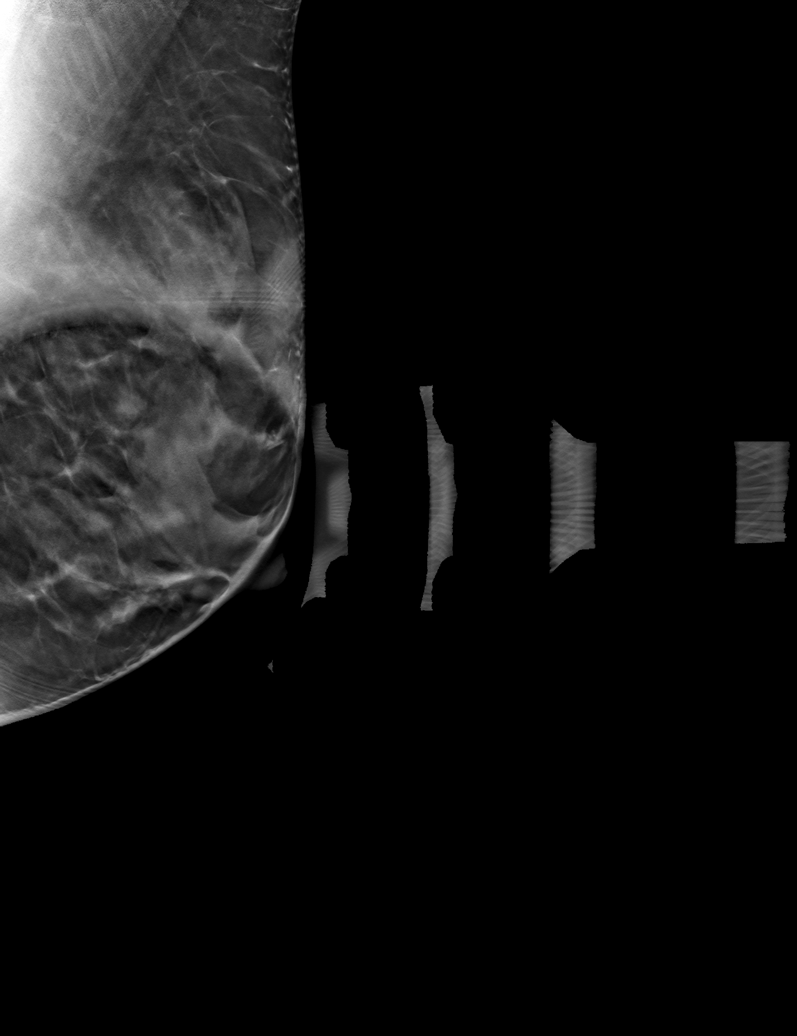

[L MLO tomo (2 of 2) · tomo slice 31/61.0]
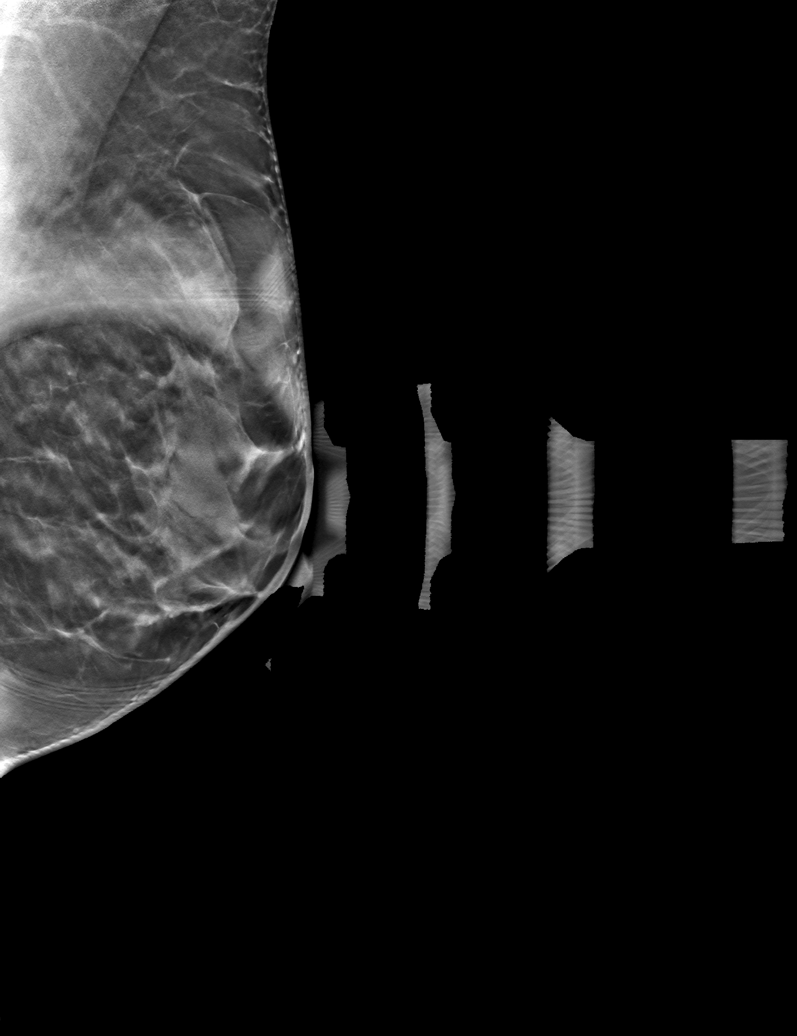

[6 of 18 positions shown; findings below may reference images not displayed]

ACR Breast Density Category c: The breast tissue is heterogeneously
dense, which may obscure small masses.
FINDINGS: 2D/3D spot compression views of the LEFT breast demonstrate a
persistent circumscribed oval low-density mass within the posterior
UPPER-OUTER LEFT breast.

Targeted ultrasound is performed, showing a 1 x 0.7 x 1.2 cm benign
cyst at the 2 o'clock position of the LEFT breast 6 cm from the
nipple corresponding to the screening study finding.
IMPRESSION: Benign cyst within the UPPER-OUTER LEFT breast corresponding to the
screening study finding.

RECOMMENDATION:
Bilateral screening mammogram in 1 year.

I have discussed the findings and recommendations with the patient.
If applicable, a reminder letter will be sent to the patient
regarding the next appointment.

BI-RADS CATEGORY  2: Benign.

## 2023-07-17 ENCOUNTER — Encounter: Payer: Self-pay | Admitting: Neurology

## 2023-08-31 NOTE — Progress Notes (Unsigned)
NEUROLOGY CONSULTATION NOTE  Denise Reeves MRN: [id number] DOB: 07-05-1971  Referring provider: Lorelei Pont, DO Primary care provider: Myna Hidalgo, DO  Reason for consult:  migraines  Assessment/Plan:   Migraine with aura, without status migrainosus, not intractable Left sided occipital neuralgia Left sided tremor involving hand and leg.  Tremor of hand may be essential tremor, but given that it is only left sided, with involvement of left leg, will check MRI of brain to rule out secondary intracranial etiology.   Check MRI of brain with and without contrast She will try samples of Nurtec as needed for rescue, as provided by her PCP.  She will let me know if effective. Defer starting preventative migraine medication for now, as they are infrequent. Limit use of pain relievers to no more than 2 days out of week to prevent risk of rebound or medication-overuse headache. Keep headache diary Monitor tremor for now.  Consider primidone if needed.  Blood pressure is a little on the low side, so would avoid propranolol. Follow up in 5 months.   Subjective:  Denise Reeves is a 52 year old right-handed female who presents for migraines.  History supplemented by referring provider's note.  Migraine Onset:  1995.  Progressively gotten worse around 2021.  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  She got help and is now working 8 hours, but headaches got worse.  Saw eye doctor.  Prescribed glasses with blue light filter for the computer which she is picking up today.   Location:  Starts over left eye and spreads to the left side of face, down jaw, left occipital region and across forehead. Quality:  pounding Intensity:  10/10.  She denies new headache, thunderclap headache or severe headache that wakes her from sleep. Aura:  sees floating lights "like fireflies" in her vision (preceding and with headache) Prodrome:  absent Associated symptoms:   Nausea, vomiting, photophobia, phonophobia, osmophobia.  She denies associated unilateral numbness or weakness. Duration:  4-5 hours (even with Excedrin Migraine) Frequency:  2-3 times a month Triggers:  unknown Relieving factors:  rest Activity:  aggravates.  Cannot function Sometimes, notes burning sensation in back of head.  Recent eye exam unremarkable.  Tremor: Started noticing tremors 3 years ago as well.  More noticeable about a year ago.  Noticed in left hand.  It may affect her ability to cook, such as chopping or opening a jar or can.  When driving, her left leg may shake as well.  Tremor mildly right hand as well but not as severe.  Her paternal grandmother had tremors at advanced age.  She also had Alzeimer's.  TSH on 07/12/2023 was 0.856.    Past NSAIDS/analgesics:  ibuprofen Past abortive triptans:  sumatriptan [place] Past abortive ergotamine:  none Past muscle relaxants:  none Past anti-emetic:  Zofran Past antihypertensive medications:  none Past antidepressant medications:  none Past anticonvulsant medications:  none Past anti-CGRP:  none Other past therapies:  none  Current NSAIDS/analgesics:  Excedrin Migraine, Tylenol, ibuprofen Current triptans:  none Current ergotamine:  none Current anti-emetic:  none Current muscle relaxants:  none Current Antihypertensive medications:  none Current Antidepressant medications:  none Current Anticonvulsant medications:  none Current anti-CGRP:  none Current Vitamins/Herbal/Supplements:  melatonin, magnesium Current Antihistamines/Decongestants:  none Other therapy:  none Birth control:  none   Caffeine:  1 cup of coffee in morning.   Diet:  50 oz water daily.  Skips meals.  For lunch, eats sweet potato or yogurt.  Almonds.  Drinks protein shake. No soda. Exercise:  She is active but walks or jogs one mile a week.   Depression:  No; Anxiety:  No Sleep hygiene:  poor.  Needs to take melatonin and magnesium.  No more than 5  hours a night.  Wakes up during the night to go to the bathroom.     Family history:  mom (migraines), aunt (brain tumor - longstanding history of migraine), grandmother (strokes, Alzheimer's)      PAST MEDICAL HISTORY: Past Medical History:  Diagnosis Date   Endometriosis    Family history of adverse reaction to anesthesia    mom PONV   Gallstones    post cholecystectomy   GERD (gastroesophageal reflux disease)    Headache    migraines  with menstrual cycles   Migraine    PONV (postoperative nausea and vomiting)    Prolapse of mitral valve    with regurgitation  [place] cardi[place] in [place] . No cardiologist now    PAST SURGICAL HISTORY: Past Surgical History:  Procedure Laterality Date   APPENDECTOMY     CESAREAN SECTION     10-17-2000   CHOLECYSTECTOMY     DIAGNOSTIC LAPAROSCOPY     ROBOTIC ASSISTED LAPAROSCOPIC HYSTERECTOMY AND SALPINGECTOMY Bilateral 06/11/2019   Procedure: XI ROBOTIC ASSISTED LAPAROSCOPIC HYSTERECTOMY WITH BILATERAL SALPINGECTOMY AND REPAIR OF VAGINAL LACERATION;  Surgeon: Gerald Leitz, MD;  Location: [facility];  Service: Gynecology;  Laterality: Bilateral;  TS RNFA confirmed on 06/05/19. CS   TUBAL LIGATION      MEDICATIONS: Current Outpatient Medications on File Prior to Visit  Medication Sig Dispense Refill   acetaminophen (TYLENOL) 500 MG tablet Take 1,000 mg by mouth every 6 (six) hours as needed for moderate pain.     ibuprofen (ADVIL) 800 MG tablet Take 1 tablet (800 mg total) by mouth every 8 (eight) hours as needed. 30 tablet 1   Multiple Vitamin (MULTIVITAMIN WITH MINERALS) TABS tablet Take 1 tablet by mouth daily.     omeprazole (PRILOSEC OTC) 20 MG tablet Take 20 mg by mouth daily.     No current facility-administered medications on file prior to visit.    ALLERGIES: Allergies  Allergen Reactions   Demerol [Meperidine Hcl]     Blood pressure drops too low    FAMILY HISTORY: Family History  Problem Relation  Age of Onset   Osteoporosis Mother    Diabetes Mellitus I Father    Breast cancer Sister 46   Breast cancer Paternal Aunt    Colon cancer Paternal Aunt    Stomach cancer Neg Hx    Rectal cancer Neg Hx     Objective:  Blood pressure 103/64, pulse 94, height 5' (1.524 m), weight 154 lb 6.4 oz (70 kg), last menstrual period 06/05/2019, SpO2 99%. General: No acute distress.  Patient appears well-groomed.   Head:  Normocephalic/atraumatic.  No TMJ tenderness to palpation.  Left suboccipital tenderness to palpation. Eyes:  fundi examined but not visualized Neck: supple, no paraspinal tenderness, full range of motion Heart: regular rate and rhythm Neurological Exam: Mental status: alert and oriented to person, place, and time, speech fluent and not dysarthric, language intact. Cranial nerves: CN I: not tested CN II: pupils equal, round and reactive to light, visual fields intact CN III, IV, VI:  full range of motion, no nystagmus, no ptosis CN V: facial sensation intact. CN VII: upper and lower face symmetric CN VIII: hearing  intact CN IX, X: gag intact, uvula midline CN XI: sternocleidomastoid and trapezius muscles intact CN XII: tongue midline Bulk & Tone: normal, no fasciculations. Motor:  muscle strength 5/5 throughout.  Postural and kinetic tremor of left hand. Sensation:  Pinprick and vibratory sensation intact. Deep Tendon Reflexes:  2+ throughout,  toes downgoing.   Finger to nose testing:  Without dysmetria.   Heel to shin:  Without dysmetria.   Gait:  Normal station and stride.  Romberg negative.    Thank you for allowing me to take part in the care of this patient.  Shon Millet, DO  CC:  Myna Hidalgo, DO  Lorelei Pont, DO

## 2023-09-03 ENCOUNTER — Ambulatory Visit: Payer: BC Managed Care – PPO | Admitting: Neurology

## 2023-09-03 ENCOUNTER — Encounter: Payer: Self-pay | Admitting: Neurology

## 2023-09-03 VITALS — BP 103/64 | HR 94 | Ht 60.0 in | Wt 154.4 lb

## 2023-09-03 DIAGNOSIS — M5481 Occipital neuralgia: Secondary | ICD-10-CM | POA: Diagnosis not present

## 2023-09-03 DIAGNOSIS — G43109 Migraine with aura, not intractable, without status migrainosus: Secondary | ICD-10-CM | POA: Diagnosis not present

## 2023-09-03 DIAGNOSIS — R251 Tremor, unspecified: Secondary | ICD-10-CM | POA: Diagnosis not present

## 2023-09-03 NOTE — Patient Instructions (Addendum)
  Check MRI of brain with and without contrast. We have sent a referral to [facility] for your MRI and they will call you directly to schedule your appointment. They are located at [address] [facility]. If you need to contact them directly please call [phone].  Take Nurtec at earliest onset of headache.  Maximum 1 tablets 24 hours.  Let me know if it works or not Limit use of pain relievers to no more than 2 days out of the week.  These medications include acetaminophen, NSAIDs (ibuprofen/Advil/Motrin, naproxen/Aleve, triptans (Imitrex/sumatriptan), Excedrin, and narcotics.  This will help reduce risk of rebound headaches. Be aware of common food triggers Routine exercise Stay adequately hydrated (aim for 64 oz water daily) Keep headache diary Maintain proper stress management Maintain proper sleep hygiene Do not skip meals Consider supplements:  magnesium citrate 400mg  daily, riboflavin 400mg  daily, coenzyme Q10 300mg  daily Monitor tremor.  Let me know if you would like to start a medication

## 2023-10-25 ENCOUNTER — Encounter: Payer: Self-pay | Admitting: Neurology

## 2023-10-29 ENCOUNTER — Other Ambulatory Visit: Payer: BC Managed Care – PPO

## 2023-12-07 ENCOUNTER — Ambulatory Visit: Payer: BC Managed Care – PPO | Admitting: Neurology

## 2023-12-10 NOTE — Progress Notes (Deleted)
 NEUROLOGY FOLLOW UP OFFICE NOTE  Denise Reeves [id number]  Assessment/Plan:   Migraine with aura, without status migrainosus, not intractable Left sided occipital neuralgia Left sided tremor involving hand and leg.  Tremor of hand may be essential tremor, but given that it is only left sided, with involvement of left leg, will check MRI of brain to rule out secondary intracranial etiology.   Check MRI of brain with and without contrast She will try samples of Nurtec as needed for rescue, as provided by her PCP.  She will let me know if effective. Defer starting preventative migraine medication for now, as they are infrequent. Limit use of pain relievers to no more than 2 days out of week to prevent risk of rebound or medication-overuse headache. Keep headache diary Monitor tremor for now.  Consider primidone if needed.  Blood pressure is a little on the low side, so would avoid propranolol. Follow up in 5 months.   Subjective:  Denise Reeves is a 53 year old right-handed female follows up for migraines and tremor.  UPDATE: She did not have MRI of brain performed.  ***  Migraines: Intensity:  *** Duration:  *** with Nurtec. Frequency:  ***  Tremor: ***  Frequency of abortive medication: *** Current NSAIDS/analgesics:  Excedrin Migraine, Tylenol, ibuprofen Current triptans:  none Current ergotamine:  none Current anti-emetic:  none Current muscle relaxants:  none Current Antihypertensive medications:  none Current Antidepressant medications:  none Current Anticonvulsant medications:  none Current anti-CGRP:  none Current Vitamins/Herbal/Supplements:  melatonin, magnesium Current Antihistamines/Decongestants:  none Other therapy:  none Birth control:  none   Caffeine:  1 cup of coffee in morning.   Diet:  50 oz water daily.  Skips meals.  For lunch, eats sweet potato or yogurt.  Almonds.  Drinks protein shake. No soda. Exercise:  She is active but  walks or jogs one mile a week.   Depression:  No; Anxiety:  No Sleep hygiene:  poor.  Needs to take melatonin and magnesium.  No more than 5 hours a night.  Wakes up during the night to go to the bathroom.    HISTORY: Migraine Onset:  1995.  Progressively gotten worse around 2021.  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  She got help and is now working 8 hours, but headaches got worse.  Saw eye doctor.  Prescribed glasses with blue light filter for the computer which she is picking up today.   Location:  Starts over left eye and spreads to the left side of face, down jaw, left occipital region and across forehead. Quality:  pounding Intensity:  10/10.  She denies new headache, thunderclap headache or severe headache that wakes her from sleep. Aura:  sees floating lights "like fireflies" in her vision (preceding and with headache) Prodrome:  absent Associated symptoms:  Nausea, vomiting, photophobia, phonophobia, osmophobia.  She denies associated unilateral numbness or weakness. Duration:  4-5 hours (even with Excedrin Migraine) Frequency:  2-3 times a month Triggers:  unknown Relieving factors:  rest Activity:  aggravates.  Cannot function Sometimes, notes burning sensation in back of head.  Recent eye exam unremarkable.  Tremor: Started noticing tremors 3 years ago as well.  More noticeable about a year ago.  Noticed in left hand.  It may affect her ability to cook, such as chopping or opening a jar or can.  When driving, her left leg may shake as well.  Tremor mildly  right hand as well but not as severe.  Her paternal grandmother had tremors at advanced age.  She also had Alzeimer's.  TSH on 07/12/2023 was 0.856.    Past NSAIDS/analgesics:  ibuprofen Past abortive triptans:  sumatriptan [place] Past abortive ergotamine:  none Past muscle relaxants:  none Past anti-emetic:  Zofran Past antihypertensive medications:  none Past antidepressant medications:   none Past anticonvulsant medications:  none Past anti-CGRP:  none Other past therapies:  none    Family history:  mom (migraines), aunt (brain tumor - longstanding history of migraine), grandmother (strokes, Alzheimer's)  PAST MEDICAL HISTORY: Past Medical History:  Diagnosis Date   Endometriosis    Family history of adverse reaction to anesthesia    mom PONV   Gallstones    post cholecystectomy   GERD (gastroesophageal reflux disease)    Headache    migraines  with menstrual cycles   Migraine    PONV (postoperative nausea and vomiting)    Prolapse of mitral valve    with regurgitation  last cardiologist in [place] . No cardiologist now    MEDICATIONS: Current Outpatient Medications on File Prior to Visit  Medication Sig Dispense Refill   acetaminophen (TYLENOL) 500 MG tablet Take 1,000 mg by mouth every 6 (six) hours as needed for moderate pain.     ibuprofen (ADVIL) 800 MG tablet Take 1 tablet (800 mg total) by mouth every 8 (eight) hours as needed. 30 tablet 1   Multiple Vitamin (MULTIVITAMIN WITH MINERALS) TABS tablet Take 1 tablet by mouth daily.     omeprazole (PRILOSEC OTC) 20 MG tablet Take 20 mg by mouth daily.     No current facility-administered medications on file prior to visit.    ALLERGIES: Allergies  Allergen Reactions   Demerol [Meperidine Hcl]     Blood pressure drops too low    FAMILY HISTORY: Family History  Problem Relation Age of Onset   Migraines Mother    Osteoporosis Mother    Diabetes Mellitus I Father    Breast cancer Sister 71   Brain cancer Maternal Aunt    Breast cancer Paternal Aunt    Colon cancer Paternal Aunt    Stroke Maternal Grandmother    Alzheimer's disease Maternal Grandmother    Stomach cancer Neg Hx    Rectal cancer Neg Hx       Objective:  *** General: No acute distress.  Patient appears ***-groomed.   Head:  Normocephalic/atraumatic Eyes:  Fundi examined but not visualized Neck: supple, no paraspinal  tenderness, full range of motion Heart:  Regular rate and rhythm Lungs:  Clear to auscultation bilaterally Back: No paraspinal tenderness Neurological Exam: alert and oriented.  Speech fluent and not dysarthric, language intact.  CN II-XII intact. Bulk and tone normal, muscle strength 5/5 throughout.  Sensation to light touch intact.  Deep tendon reflexes 2+ throughout, toes downgoing.  Finger to nose testing intact.  Gait normal, Romberg negative.   Shon Millet, DO  CC: ***

## 2023-12-11 ENCOUNTER — Ambulatory Visit: Payer: BC Managed Care – PPO | Admitting: Neurology

## 2023-12-13 NOTE — Progress Notes (Signed)
 NEUROLOGY FOLLOW UP OFFICE NOTE  Denise Reeves [id number]  Assessment/Plan:   Migraine with aura, without status migrainosus, not intractable, worsening Left sided occipital neuralgia Left sided tremor involving hand and leg.  Tremor of hand may be essential tremor, but given that it is only left sided, with involvement of left leg, will check MRI of brain to rule out secondary intracranial etiology. Zoning out - I think related to stress and exhaustion.  No loss of awareness.     Due to worsening headaches as well as left sided upper and lower tremor, will resubmit order for MRI of brain with and without contrast (request to include upper cervical spine as well) Migraine prevention:  start topiramate 25mg  at bedtime (may help with tremor as well).  We can increase dose in 4 weeks if needed. Migraine rescue:  try rizatriptan 10mg . Limit use of pain relievers to no more than 2 days out of week to prevent risk of rebound or medication-overuse headache. Keep headache diary Monitor tremor for now.  Consider primidone if needed.  Blood pressure is a little on the low side, so would avoid propranolol. Follow up in 5 months.   Subjective:  Denise Reeves is a 53 year old right-handed female follows up for migraines and tremor.  UPDATE: She did not have MRI of brain performed.  She received a letter stating it wasn't approved.    Migraines: Has done lifestyle modification:  migraine diet modification, sleep hygiene improvement.  Tried memory foam pillow.   Nurtec was ineffective Reports increased migraine frequency. Intensity:  severe Duration:  4-5 hours Frequency:  11 days in last 4 weeks.  Tremor: Still with tremor in left arm and hand.  Not at rest.  No associated arm pain or hand numbness.  Less in the leg.     She has had brief episodes where she zones out.  She is awake with eyes opened but does not move.  Not unconscious.  Happens while working and also  happened while driving.  Occurred 5 times, lasting just a minute or 2.  May be due to exhaustion.    Current NSAIDS/analgesics:  Excedrin Migraine, Tylenol, ibuprofen Current triptans:  none Current ergotamine:  none Current anti-emetic:  none Current muscle relaxants:  none Current Antihypertensive medications:  none Current Antidepressant medications:  none Current Anticonvulsant medications:  none Current anti-CGRP:  none Current Vitamins/Herbal/Supplements:  melatonin, magnesium Current Antihistamines/Decongestants:  none Other therapy:  none Birth control:  none   Caffeine:  1 cup of coffee in morning.   Diet:  50 oz water daily.  Skips meals.  For lunch, eats sweet potato or yogurt.  Almonds.  Drinks protein shake. No soda. Exercise:  She is active but walks or jogs one mile a week.   Depression:  No; Anxiety:  No Sleep hygiene:  poor.  Needs to take melatonin and magnesium.  No more than 5 hours a night.  Wakes up during the night to go to the bathroom.    HISTORY: Migraine Onset:  1995.  Progressively gotten worse around 2021.  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  She got help and is now working 8 hours, but headaches got worse.  Saw eye doctor.  Prescribed glasses with blue light filter for the computer which she is picking up today.   Location:  Starts over left eye and spreads to the left side of face, down jaw, left occipital region  and across forehead. Quality:  pounding Intensity:  10/10.  She denies new headache, thunderclap headache or severe headache that wakes her from sleep. Aura:  sees floating lights "like fireflies" in her vision (preceding and with headache) Prodrome:  absent Associated symptoms:  Nausea, vomiting, photophobia, phonophobia, osmophobia.  She denies associated unilateral numbness or weakness. Duration:  4-5 hours (even with Excedrin Migraine) Frequency:  2-3 times a month Triggers:  unknown Relieving factors:   rest Activity:  aggravates.  Cannot function Sometimes, notes burning sensation in back of head.  Recent eye exam unremarkable.  Tremor: Started noticing tremors 3 years ago as well.  More noticeable about a year ago.  Noticed in left hand.  It may affect her ability to cook, such as chopping or opening a jar or can.  When driving, her left leg may shake as well.  Tremor mildly right hand as well but not as severe.  Her paternal grandmother had tremors at advanced age.  She also had Alzeimer's.  TSH on 07/12/2023 was 0.856.    Past NSAIDS/analgesics:  ibuprofen Past abortive triptans:  sumatriptan [place] Past abortive ergotamine:  none Past muscle relaxants:  none Past anti-emetic:  Zofran Past antihypertensive medications:  none Past antidepressant medications:  none Past anticonvulsant medications:  none Past anti-CGRP:  Nurtec PRN Other past therapies:  PT neck.    Family history:  mom (migraines), aunt (brain tumor - longstanding history of migraine), grandmother (strokes, Alzheimer's)  PAST MEDICAL HISTORY: Past Medical History:  Diagnosis Date   Endometriosis    Family history of adverse reaction to anesthesia    mom PONV   Gallstones    post cholecystectomy   GERD (gastroesophageal reflux disease)    Headache    migraines  with menstrual cycles   Migraine    PONV (postoperative nausea and vomiting)    Prolapse of mitral valve    with regurgitation  last cardiologist in [place] . No cardiologist now    MEDICATIONS: Current Outpatient Medications on File Prior to Visit  Medication Sig Dispense Refill   acetaminophen (TYLENOL) 500 MG tablet Take 1,000 mg by mouth every 6 (six) hours as needed for moderate pain.     ibuprofen (ADVIL) 800 MG tablet Take 1 tablet (800 mg total) by mouth every 8 (eight) hours as needed. 30 tablet 1   Multiple Vitamin (MULTIVITAMIN WITH MINERALS) TABS tablet Take 1 tablet by mouth daily.     omeprazole (PRILOSEC OTC) 20 MG tablet Take 20  mg by mouth daily.     No current facility-administered medications on file prior to visit.    ALLERGIES: Allergies  Allergen Reactions   Demerol [Meperidine Hcl]     Blood pressure drops too low    FAMILY HISTORY: Family History  Problem Relation Age of Onset   Migraines Mother    Osteoporosis Mother    Diabetes Mellitus I Father    Breast cancer Sister 69   Brain cancer Maternal Aunt    Breast cancer Paternal Aunt    Colon cancer Paternal Aunt    Stroke Maternal Grandmother    Alzheimer's disease Maternal Grandmother    Stomach cancer Neg Hx    Rectal cancer Neg Hx       Objective:  Blood pressure (!) 87/32, pulse 96, height 5' (1.524 m), weight 153 lb (69.4 kg), last menstrual period 06/05/2019, SpO2 96%. General: No acute distress.  Patient appears well-groomed.   Head:  Normocephalic/atraumatic Eyes:  Fundi examined but not visualized  Neck: supple, no paraspinal tenderness, full range of motion Heart:  Regular rate and rhythm Neurological Exam: alert and oriented.  Speech fluent and not dysarthric, language intact.  CN II-XII intact. Bulk and tone normal, muscle strength 5/5 throughout.  Postural and kinetic tremor of left hand.  Sensation to light touch intact.  Deep tendon reflexes 2+ throughout, toes downgoing.  Finger to nose testing intact.  Gait normal, Romberg negative.   Shon Millet, DO  CC: Belva Agee, NP

## 2023-12-17 ENCOUNTER — Encounter: Payer: Self-pay | Admitting: Neurology

## 2023-12-17 ENCOUNTER — Ambulatory Visit: Payer: BC Managed Care – PPO | Admitting: Neurology

## 2023-12-17 VITALS — BP 129/75 | HR 96 | Ht 60.0 in | Wt 153.0 lb

## 2023-12-17 DIAGNOSIS — G43109 Migraine with aura, not intractable, without status migrainosus: Secondary | ICD-10-CM

## 2023-12-17 DIAGNOSIS — R519 Headache, unspecified: Secondary | ICD-10-CM

## 2023-12-17 DIAGNOSIS — M542 Cervicalgia: Secondary | ICD-10-CM

## 2023-12-17 DIAGNOSIS — R251 Tremor, unspecified: Secondary | ICD-10-CM | POA: Diagnosis not present

## 2023-12-17 MED ORDER — RIZATRIPTAN BENZOATE 10 MG PO TABS: 10.0000 mg | ORAL_TABLET | ORAL | 5 refills | Status: DC | PRN

## 2023-12-17 MED ORDER — TOPIRAMATE 25 MG PO TABS: 25.0000 mg | ORAL_TABLET | Freq: Every day | ORAL | 5 refills | Status: DC

## 2023-12-17 NOTE — Patient Instructions (Signed)
 MRI of brain with and without contrast including upper cervical spine Start topiramate 25mg  at bedtime.  If no improvement in headaches in 4 weeks, contact me and I will increase dose Take rizatriptan at earliest onset of migraine.  May repeat after 2 hours.  Maximum 2 tablets in 24 hours.

## 2024-01-01 ENCOUNTER — Other Ambulatory Visit: Payer: Self-pay | Admitting: Neurology

## 2024-01-03 ENCOUNTER — Encounter: Payer: Self-pay | Admitting: Neurology

## 2024-01-04 ENCOUNTER — Other Ambulatory Visit: Payer: Self-pay | Admitting: Neurology

## 2024-01-04 ENCOUNTER — Telehealth: Payer: Self-pay | Admitting: Neurology

## 2024-01-04 ENCOUNTER — Telehealth: Payer: Self-pay

## 2024-01-04 DIAGNOSIS — M542 Cervicalgia: Secondary | ICD-10-CM

## 2024-01-04 MED ORDER — ELETRIPTAN HYDROBROMIDE 40 MG PO TABS: 40.0000 mg | ORAL_TABLET | ORAL | 5 refills | Status: DC | PRN

## 2024-01-04 NOTE — Telephone Encounter (Signed)
 Per Dr.Jaffe,ok, to order MRI Cervical spine W/O Contrast for Cervicogenic  headaches.

## 2024-01-04 NOTE — Telephone Encounter (Signed)
 Patient advised of Dr.Jaffe note, Please let patient know the rizatriptan that I prescribed is backordered, so I sent in a prescription for a different similar medication called eletriptan.

## 2024-01-04 NOTE — Telephone Encounter (Signed)
 Rizatriptan backordered.  Will send in prescription for eletriptan 40mg .

## 2024-01-07 ENCOUNTER — Encounter: Payer: Self-pay | Admitting: Neurology

## 2024-01-10 ENCOUNTER — Ambulatory Visit
Admission: RE | Admit: 2024-01-10 | Discharge: 2024-01-10 | Disposition: A | Discharge status: Home / Self Care | Source: Ambulatory Visit | Attending: Neurology | Admitting: Neurology

## 2024-01-10 ENCOUNTER — Ambulatory Visit
Admission: RE | Admit: 2024-01-10 | Discharge: 2024-01-10 | Disposition: A | Discharge status: Home / Self Care | Payer: BC Managed Care – PPO | Source: Ambulatory Visit | Attending: Neurology | Admitting: Neurology

## 2024-01-10 DIAGNOSIS — R251 Tremor, unspecified: Secondary | ICD-10-CM

## 2024-01-10 DIAGNOSIS — R519 Headache, unspecified: Secondary | ICD-10-CM

## 2024-01-10 DIAGNOSIS — M542 Cervicalgia: Secondary | ICD-10-CM

## 2024-01-10 MED ORDER — GADOPICLENOL 0.5 MMOL/ML IV SOLN
8.0000 mL | Freq: Once | INTRAVENOUS | Status: AC | PRN
  Administered 2024-01-10: 8 mL via INTRAVENOUS

## 2024-01-11 NOTE — Progress Notes (Signed)
 Patient advised.

## 2024-01-17 ENCOUNTER — Other Ambulatory Visit

## 2024-01-28 ENCOUNTER — Encounter: Payer: Self-pay | Admitting: Neurology

## 2024-02-06 ENCOUNTER — Ambulatory Visit: Payer: BC Managed Care – PPO | Admitting: Neurology

## 2024-06-23 NOTE — Progress Notes (Deleted)
 NEUROLOGY FOLLOW UP OFFICE NOTE  Elouise Divelbiss [id number]  Assessment/Plan:   Migraine with aura, without status migrainosus, not intractable, worsening Left sided occipital neuralgia Left sided tremor involving hand and leg.  Tremor of hand may be essential tremor, but given that it is only left sided, with involvement of left leg, will check MRI of brain to rule out secondary intracranial etiology. Zoning out - I think related to stress and exhaustion.  No loss of awareness.     Due to worsening headaches as well as left sided upper and lower tremor, will resubmit order for MRI of brain with and without contrast (request to include upper cervical spine as well) Migraine prevention:  start topiramate  25mg  at bedtime (may help with tremor as well).  We can increase dose in 4 weeks if needed. Migraine rescue:  try rizatriptan  10mg . Limit use of pain relievers to no more than 2 days out of week to prevent risk of rebound or medication-overuse headache. Keep headache diary Monitor tremor for now.  Consider primidone if needed.  Blood pressure is a little on the low side, so would avoid propranolol. Follow up in 5 months.   Subjective:  Nilani Hugill is a 53 year old right-handed female follows up for migraines and tremor.  MRIs in March personally reviewed.  UPDATE: She did not have MRI of brain performed.  She received a letter stating it wasn't approved.    Migraines: Due to worsening migraines and left sided tremor, she had MRIs performed on 01/10/2024.  MRI of brain with and without contrast was normal.  MRI of cervical spine without contrast showed cervical spondylosis with mild-to-moderate foraminal stenosis on right at C4-C5 and mild right foraminal stenosis at C5-C6 where there is mild-to-moderate disc degeneration.  Started topiramate . Intensity:  severe Duration:  *** with eletriptan  Frequency:  11 days in last 4 weeks. ***  Tremor: ***    Current  NSAIDS/analgesics:  Excedrin Migraine, Tylenol , ibuprofen  Current triptans:  eletriptan  40mg  Current ergotamine:  none Current anti-emetic:  none Current muscle relaxants:  none Current Antihypertensive medications:  none Current Antidepressant medications:  none Current Anticonvulsant medications:  topiramate  25mg  at bedtime Current anti-CGRP:  none Current Vitamins/Herbal/Supplements:  melatonin, magnesium Current Antihistamines/Decongestants:  none Other therapy:  none Birth control:  none   Caffeine:  1 cup of coffee in morning.   Diet:  50 oz water  daily.  Skips meals.  For lunch, eats sweet potato or yogurt.  Almonds.  Drinks protein shake. No soda. Exercise:  She is active but walks or jogs one mile a week.   Depression:  No; Anxiety:  No Sleep hygiene:  poor.  Needs to take melatonin and magnesium.  No more than 5 hours a night.  Wakes up during the night to go to the bathroom.    HISTORY: Migraine Onset:  1995.  Progressively gotten worse around 2021.  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  She got help and is now working 8 hours, but headaches got worse.  Saw eye doctor.  Prescribed glasses with blue light filter for the computer which she is picking up today.   Location:  Starts over left eye and spreads to the left side of face, down jaw, left occipital region and across forehead. Quality:  pounding Intensity:  10/10.  She denies new headache, thunderclap headache or severe headache that wakes her from sleep. Aura:  sees floating lights like fireflies  in her vision (preceding and with headache) Prodrome:  absent Associated symptoms:  Nausea, vomiting, photophobia, phonophobia, osmophobia.  She denies associated unilateral numbness or weakness. Duration:  4-5 hours (even with Excedrin Migraine) Frequency:  2-3 times a month Triggers:  unknown Relieving factors:  rest Activity:  aggravates.  Cannot function Sometimes, notes burning  sensation in back of head.  Recent eye exam unremarkable.  Tremor: Started noticing tremors 3 years ago as well.  More noticeable about a year ago.  Noticed in left hand.  It may affect her ability to cook, such as chopping or opening a jar or can.  When driving, her left leg may shake as well.  Tremor mildly right hand as well but not as severe.  Her paternal grandmother had tremors at advanced age.  She also had Alzeimer's.  TSH on 07/12/2023 was 0.856.    Transient altered sensorium: She has had brief episodes where she zones out.  She is awake with eyes opened but does not move.  Not unconscious.  Happens while working and also happened while driving.  Occurred 5 times, lasting just a minute or 2.  May be due to exhaustion.   Past medications: Past NSAIDS/analgesics:  ibuprofen  Past abortive triptans:  sumatriptan [place] Past abortive ergotamine:  none Past muscle relaxants:  none Past anti-emetic:  Zofran  Past antihypertensive medications:  none Past antidepressant medications:  none Past anticonvulsant medications:  none Past anti-CGRP:  Nurtec PRN Other past therapies:  PT neck.    Family history:  mom (migraines), aunt (brain tumor - longstanding history of migraine), grandmother (strokes, Alzheimer's)  PAST MEDICAL HISTORY: Past Medical History:  Diagnosis Date   Endometriosis    Family history of adverse reaction to anesthesia    mom PONV   Gallstones    post cholecystectomy   GERD (gastroesophageal reflux disease)    Headache    migraines  with menstrual cycles   Migraine    PONV (postoperative nausea and vomiting)    Prolapse of mitral valve    with regurgitation  [place] cardiologist in [place] . No cardiologist now    MEDICATIONS: Current Outpatient Medications on File Prior to Visit  Medication Sig Dispense Refill   eletriptan  (RELPAX ) 40 MG tablet Take 1 tablet (40 mg total) by mouth as needed for migraine or headache. May repeat in 2 hours if headache  persists or recurs.  Maximum 2 tablets in 24 hours. 10 tablet 5   ibuprofen  (ADVIL ) 800 MG tablet Take 1 tablet (800 mg total) by mouth every 8 (eight) hours as needed. 30 tablet 1   Multiple Vitamin (MULTIVITAMIN WITH MINERALS) TABS tablet Take 1 tablet by mouth daily.     omeprazole (PRILOSEC OTC) 20 MG tablet Take 20 mg by mouth daily.     topiramate  (TOPAMAX ) 25 MG tablet Take 1 tablet (25 mg total) by mouth at bedtime. 30 tablet 5   No current facility-administered medications on file prior to visit.    ALLERGIES: Allergies  Allergen Reactions   Demerol [Meperidine Hcl]     Blood pressure drops too low    FAMILY HISTORY: Family History  Problem Relation Age of Onset   Migraines Mother    Osteoporosis Mother    Diabetes Mellitus I Father    Breast cancer Sister 76   Brain cancer Maternal Aunt    Breast cancer Paternal Aunt    Colon cancer Paternal Aunt    Stroke Maternal Grandmother    Alzheimer's disease Maternal Grandmother  Stomach cancer Neg Hx    Rectal cancer Neg Hx       Objective:  *** General: No acute distress.  Patient appears well-groomed.   ***   Juliene Dunnings, DO  CC: Devere Duos, NP

## 2024-06-27 ENCOUNTER — Ambulatory Visit: Payer: BC Managed Care – PPO | Admitting: Neurology

## 2024-07-01 NOTE — Progress Notes (Unsigned)
 NEUROLOGY FOLLOW UP OFFICE NOTE  Denise Reeves [id number]  Assessment/Plan:   Migraine with aura, without status migrainosus, not intractable, worsening Left sided occipital neuralgia Left sided tremor involving hand and leg.  Tremor of hand may be essential tremor, but given that it is only left sided, with involvement of left leg, will check MRI of brain to rule out secondary intracranial etiology. Zoning out - I think related to stress and exhaustion.  No loss of awareness.     Due to worsening headaches as well as left sided upper and lower tremor, will resubmit order for MRI of brain with and without contrast (request to include upper cervical spine as well) Migraine prevention:  start topiramate  25mg  at bedtime (may help with tremor as well).  We can increase dose in 4 weeks if needed. Migraine rescue:  try rizatriptan  10mg . Limit use of pain relievers to no more than 2 days out of week to prevent risk of rebound or medication-overuse headache. Keep headache diary Monitor tremor for now.  Consider primidone if needed.  Blood pressure is a little on the low side, so would avoid propranolol. Follow up in 5 months.   Subjective:  Denise Reeves is a 53 year old right-handed female follows up for migraines and tremor.  MRIs in March personally reviewed.  UPDATE: She did not have MRI of brain performed.  She received a letter stating it wasn't approved.    Migraines: Due to worsening migraines and left sided tremor, she had MRIs performed on 01/10/2024.  MRI of brain with and without contrast was normal.  MRI of cervical spine without contrast showed cervical spondylosis with mild-to-moderate foraminal stenosis on right at C4-C5 and mild right foraminal stenosis at C5-C6 where there is mild-to-moderate disc degeneration.  Started topiramate . Intensity:  severe Duration:  *** with eletriptan  Frequency:  11 days in last 4 weeks. ***  Tremor: ***    Current  NSAIDS/analgesics:  Excedrin Migraine, Tylenol , ibuprofen  Current triptans:  eletriptan  40mg  Current ergotamine:  none Current anti-emetic:  none Current muscle relaxants:  none Current Antihypertensive medications:  none Current Antidepressant medications:  none Current Anticonvulsant medications:  topiramate  25mg  at bedtime Current anti-CGRP:  none Current Vitamins/Herbal/Supplements:  melatonin, magnesium Current Antihistamines/Decongestants:  none Other therapy:  none Birth control:  none   Caffeine:  1 cup of coffee in morning.   Diet:  50 oz water  daily.  Skips meals.  For lunch, eats sweet potato or yogurt.  Almonds.  Drinks protein shake. No soda. Exercise:  She is active but walks or jogs one mile a week.   Depression:  No; Anxiety:  No Sleep hygiene:  poor.  Needs to take melatonin and magnesium.  No more than 5 hours a night.  Wakes up during the night to go to the bathroom.    HISTORY: Migraine Onset:  1995.  Progressively gotten worse around 2021.  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  She got help and is now working 8 hours, but headaches got worse.  Saw eye doctor.  Prescribed glasses with blue light filter for the computer which she is picking up today.   Location:  Starts over left eye and spreads to the left side of face, down jaw, left occipital region and across forehead. Quality:  pounding Intensity:  10/10.  She denies new headache, thunderclap headache or severe headache that wakes her from sleep. Aura:  sees floating lights like fireflies  in her vision (preceding and with headache) Prodrome:  absent Associated symptoms:  Nausea, vomiting, photophobia, phonophobia, osmophobia.  She denies associated unilateral numbness or weakness. Duration:  4-5 hours (even with Excedrin Migraine) Frequency:  2-3 times a month Triggers:  unknown Relieving factors:  rest Activity:  aggravates.  Cannot function Sometimes, notes burning  sensation in back of head.  Recent eye exam unremarkable.  Tremor: Started noticing tremors 3 years ago as well.  More noticeable about a year ago.  Noticed in left hand.  It may affect her ability to cook, such as chopping or opening a jar or can.  When driving, her left leg may shake as well.  Tremor mildly right hand as well but not as severe.  Her paternal grandmother had tremors at advanced age.  She also had Alzeimer's.  TSH on 07/12/2023 was 0.856.    Transient altered sensorium: She has had brief episodes where she zones out.  She is awake with eyes opened but does not move.  Not unconscious.  Happens while working and also happened while driving.  Occurred 5 times, lasting just a minute or 2.  May be due to exhaustion.   Past medications: Past NSAIDS/analgesics:  ibuprofen  Past abortive triptans:  sumatriptan [place] Past abortive ergotamine:  none Past muscle relaxants:  none Past anti-emetic:  Zofran  Past antihypertensive medications:  none Past antidepressant medications:  none Past anticonvulsant medications:  none Past anti-CGRP:  Nurtec PRN Other past therapies:  PT neck.    Family history:  mom (migraines), aunt (brain tumor - longstanding history of migraine), grandmother (strokes, Alzheimer's)  PAST MEDICAL HISTORY: Past Medical History:  Diagnosis Date   Endometriosis    Family history of adverse reaction to anesthesia    mom PONV   Gallstones    post cholecystectomy   GERD (gastroesophageal reflux disease)    Headache    migraines  with menstrual cycles   Migraine    PONV (postoperative nausea and vomiting)    Prolapse of mitral valve    with regurgitation  last cardiologist in [place] . No cardiologist now    MEDICATIONS: Current Outpatient Medications on File Prior to Visit  Medication Sig Dispense Refill   eletriptan  (RELPAX ) 40 MG tablet Take 1 tablet (40 mg total) by mouth as needed for migraine or headache. May repeat in 2 hours if headache  persists or recurs.  Maximum 2 tablets in 24 hours. 10 tablet 5   ibuprofen  (ADVIL ) 800 MG tablet Take 1 tablet (800 mg total) by mouth every 8 (eight) hours as needed. 30 tablet 1   Multiple Vitamin (MULTIVITAMIN WITH MINERALS) TABS tablet Take 1 tablet by mouth daily.     omeprazole (PRILOSEC OTC) 20 MG tablet Take 20 mg by mouth daily.     topiramate  (TOPAMAX ) 25 MG tablet Take 1 tablet (25 mg total) by mouth at bedtime. 30 tablet 5   No current facility-administered medications on file prior to visit.    ALLERGIES: Allergies  Allergen Reactions   Demerol [Meperidine Hcl]     Blood pressure drops too low    FAMILY HISTORY: Family History  Problem Relation Age of Onset   Migraines Mother    Osteoporosis Mother    Diabetes Mellitus I Father    Breast cancer Sister 76   Brain cancer Maternal Aunt    Breast cancer Paternal Aunt    Colon cancer Paternal Aunt    Stroke Maternal Grandmother    Alzheimer's disease Maternal Grandmother  Stomach cancer Neg Hx    Rectal cancer Neg Hx       Objective:  *** General: No acute distress.  Patient appears well-groomed.   ***   Juliene Dunnings, DO  CC: Devere Duos, NP

## 2024-07-02 ENCOUNTER — Ambulatory Visit: Admitting: Neurology

## 2024-07-02 ENCOUNTER — Encounter: Payer: Self-pay | Admitting: Neurology

## 2024-07-02 VITALS — BP 104/68 | HR 85 | Ht 60.0 in | Wt 158.0 lb

## 2024-07-02 DIAGNOSIS — R251 Tremor, unspecified: Secondary | ICD-10-CM | POA: Diagnosis not present

## 2024-07-02 DIAGNOSIS — G43109 Migraine with aura, not intractable, without status migrainosus: Secondary | ICD-10-CM | POA: Diagnosis not present

## 2024-07-02 MED ORDER — ELETRIPTAN HYDROBROMIDE 40 MG PO TABS: 40.0000 mg | ORAL_TABLET | ORAL | 5 refills | Status: AC | PRN

## 2024-07-02 MED ORDER — TOPIRAMATE 50 MG PO TABS: 50.0000 mg | ORAL_TABLET | Freq: Every day | ORAL | 5 refills | Status: AC

## 2024-07-02 NOTE — Patient Instructions (Signed)
 Increase topiramate  to 50mg  at bedtime.  If no improvement in tremor in 6 weeks, contact me.  If you have unwanted side effects that don't resolve, contact me sooner. Eletriptan  as needed/directed for migraine

## 2024-07-25 LAB — CYTOLOGY - PAP: CYTOLOGY - PAP: NEGATIVE

## 2024-08-29 ENCOUNTER — Ambulatory Visit: Admitting: Obstetrics & Gynecology

## 2024-08-29 ENCOUNTER — Encounter: Payer: Self-pay | Admitting: Obstetrics & Gynecology

## 2024-08-29 VITALS — BP 110/62 | HR 87 | Ht 66.0 in | Wt 155.0 lb

## 2024-08-29 DIAGNOSIS — N940 Mittelschmerz: Secondary | ICD-10-CM | POA: Diagnosis not present

## 2024-08-29 DIAGNOSIS — N760 Acute vaginitis: Secondary | ICD-10-CM

## 2024-08-29 DIAGNOSIS — N941 Unspecified dyspareunia: Secondary | ICD-10-CM | POA: Diagnosis not present

## 2024-08-29 MED ORDER — IBUPROFEN 600 MG PO TABS: 600.0000 mg | ORAL_TABLET | Freq: Four times a day (QID) | ORAL | 3 refills | Status: AC | PRN

## 2024-08-29 MED ORDER — FLUCONAZOLE 150 MG PO TABS: ORAL_TABLET | ORAL | 3 refills | Status: AC

## 2024-08-29 NOTE — Progress Notes (Signed)
 GYN VISIT Patient name: Denise Reeves MRN [id number]  Date of birth: 1971/07/05 Chief Complaint:   No chief complaint on file.  History of Present Illness:   Denise Reeves is a 53 y.o. G3P1001 PH female being seen today for the following concerns:  S/p hysterectomy- still having pain on the right- makes her crazy Notes bloating, last for a few days then goes away.  Seems to be occurring more often.  US  completed by PCP- records reviewed: 07-31-2024: Absent uterus.  Normal left ovary.  Right ovary with either septated or 2 adjacent cysts measuring up to 3.8 cm  After hyst- notes recurrent yeast s/p dyspareunia.  Has taken Diflucan which does help and make symptoms resolve.  She notes itching and irritation.  Pt has Rx for vaginal estrogen cream- but was waiting to speak with me before she started this.  She notes considerable discomfort and dryness with intercourse.  Patient's last menstrual period was 06/05/2019.    Review of Systems:   Pertinent items are noted in HPI Denies fever/chills, dizziness, headaches, visual disturbances, fatigue, shortness of breath, chest pain. Pertinent History Reviewed:   Past Surgical History:  Procedure Laterality Date   APPENDECTOMY     CESAREAN SECTION     10-17-2000   CHOLECYSTECTOMY     DIAGNOSTIC LAPAROSCOPY     ROBOTIC ASSISTED LAPAROSCOPIC HYSTERECTOMY AND SALPINGECTOMY Bilateral 06/11/2019   Procedure: XI ROBOTIC ASSISTED LAPAROSCOPIC HYSTERECTOMY WITH BILATERAL SALPINGECTOMY AND REPAIR OF VAGINAL LACERATION;  Surgeon: Rosalva Sawyer, MD;  Location: [facility] [place] [facility];  Service: Gynecology;  Laterality: Bilateral;  TS RNFA confirmed on 06/05/19. CS   TUBAL LIGATION      Past Medical History:  Diagnosis Date   Endometriosis    Family history of adverse reaction to anesthesia    mom PONV   Gallstones    post cholecystectomy   GERD (gastroesophageal reflux disease)    Headache    migraines  with menstrual cycles    Migraine    PONV (postoperative nausea and vomiting)    Prolapse of mitral valve    with regurgitation  last cardiologist in [place] . No cardiologist now   Reviewed problem list, medications and allergies. Physical Assessment:   Vitals:   08/29/24 0836  BP: 110/62  Pulse: 87  Weight: 155 lb (70.3 kg)  Height: 5' 6 (1.676 m)  Body mass index is 25.02 kg/m.       Physical Examination:   General appearance: alert, well appearing, and in no distress  Psych: mood appropriate, normal affect  Skin: warm & dry   Cardiovascular: normal heart rate noted  Respiratory: normal respiratory effort, no distress  Abdomen: soft, no rebound or guarding.  Low right pelvic pain with deep palpation  Pelvic: examination not indicated  Extremities: no edema or calf tenderness bilaterally  Chaperone: N/A    Assessment & Plan:  1) Ovulatory pain - Reviewed ultrasound report and reassured patient of benign findings - Recommendation for NSAIDs to take as needed - May also use heating pack - Also discussed herbal supplements 2) Recurrent vaginitis -Discussed pH imbalance and recommendation for probiotics if desired - Okay to take Diflucan weekly as needed -Additionally may see improvement status post vaginal estrogen therapy 3) Dyspareunia - Reviewed over-the-counter personal moisturizers - Discussed water -based lubricants - Okay to use estrogen cream-patient already has prescription.  Reviewed use recommendation for daily x 2 weeks then twice weekly  Questions and concerns were addressed regarding all above information -  Plan to follow-up in 6 months  Meds ordered this encounter  Medications   ibuprofen  (ADVIL ) 600 MG tablet    Sig: Take 1 tablet (600 mg total) by mouth every 6 (six) hours as needed for mild pain (pain score 1-3), moderate pain (pain score 4-6) or cramping.    Dispense:  30 tablet    Refill:  3   fluconazole (DIFLUCAN) 150 MG tablet    Sig: Take weekly as needed     Dispense:  10 tablet    Refill:  3     No orders of the defined types were placed in this encounter.   Return in about 6 months (around 02/26/2025) for Medication follow up.   Esra Frankowski, DO Attending Obstetrician & Gynecologist, [facility] for [employer], [facility]

## 2025-03-13 ENCOUNTER — Ambulatory Visit: Admitting: Neurology
